# Patient Record
Sex: Female | Born: 1970 | Race: White | Hispanic: No | Marital: Married | Smoking: Never smoker
Health system: Southern US, Community
[De-identification: ages and names within clinical notes are randomized; demographics above are authoritative.]

## PROBLEM LIST (undated history)

## (undated) DIAGNOSIS — K219 Gastro-esophageal reflux disease without esophagitis: Secondary | ICD-10-CM

## (undated) DIAGNOSIS — L570 Actinic keratosis: Secondary | ICD-10-CM

## (undated) DIAGNOSIS — G43909 Migraine, unspecified, not intractable, without status migrainosus: Secondary | ICD-10-CM

## (undated) HISTORY — PX: NASAL SINUS SURGERY: SHX719

## (undated) HISTORY — DX: Actinic keratosis: L57.0

## (undated) HISTORY — DX: Migraine, unspecified, not intractable, without status migrainosus: G43.909

---

## 2000-10-15 HISTORY — PX: TUBAL LIGATION: SHX77

## 2006-09-11 ENCOUNTER — Ambulatory Visit: Payer: Self-pay | Admitting: Family Medicine

## 2006-10-31 ENCOUNTER — Encounter: Payer: Self-pay | Admitting: Obstetrics & Gynecology

## 2006-10-31 ENCOUNTER — Ambulatory Visit: Payer: Self-pay | Admitting: Obstetrics & Gynecology

## 2007-01-09 ENCOUNTER — Ambulatory Visit: Payer: Self-pay | Admitting: Obstetrics & Gynecology

## 2007-10-16 HISTORY — PX: BACK SURGERY: SHX140

## 2007-10-16 HISTORY — PX: BREAST BIOPSY: SHX20

## 2007-11-13 ENCOUNTER — Encounter: Payer: Self-pay | Admitting: Obstetrics & Gynecology

## 2007-11-13 ENCOUNTER — Ambulatory Visit: Payer: Self-pay | Admitting: Obstetrics & Gynecology

## 2007-12-10 ENCOUNTER — Ambulatory Visit (HOSPITAL_COMMUNITY): Admission: RE | Admit: 2007-12-10 | Discharge: 2007-12-11 | Payer: Self-pay | Admitting: Neurosurgery

## 2008-08-23 ENCOUNTER — Ambulatory Visit: Payer: Self-pay | Admitting: Internal Medicine

## 2011-02-27 NOTE — Assessment & Plan Note (Signed)
NAME:  Anne Montgomery, GRUSSING NO.:  [id number]   MEDICAL RECORD NO.:  [id number]          PATIENT TYPE:  POB   LOCATION:  [facility] at [facility]         FACILITY:  [facility]   PHYSICIAN:  Allie Bossier, MD        DATE OF BIRTH:  1971-09-23   DATE OF SERVICE:  11/13/2007                                  CLINIC NOTE   Ms. Alben Spittle is a 40 year old, married, white gravida 0, who is here for  annual exam.  She is wishes to continue her continuous Necon.  She is  happy with no periods and has no other complaints.   PAST MEDICAL HISTORY:  Endometriosis and lumbar disk herniation.  She  sees a chiropractor named Dr. Remo Lipps in [place].   REVIEW OF SYSTEMS:  She is child free.  She has been married for 14  years.  She is self-employed as a Administrator.  She denies dyspareunia.  She reports a mammogram done in 2008 that was normal.   PAST SURGICAL HISTORY:  Laparoscopic right salpingo-oophorectomy and a  septoplasty of her nose.   ALLERGIES:  NO LATEX ALLERGIES.  SHE DOES SAY THAT CODEINE CAUSES  ITCHING.   FAMILY HISTORY:  No GYN, breast, uterine, or colon malignancies.  Father  has a lymphoma and there is hypertension in the family.   PHYSICAL EXAMINATION:  Weight 172 pounds.  Height 5 feet 7 inches.  Blood pressure 141/89.  Pulse 62.  HEENT:  Normal.  BREASTS:  Bilaterally no nipple discharge, skin changes, or masses.  HEART:  Regular rate and rhythm.  LUNGS:  Clear to auscultation bilaterally.  ABDOMEN:  No hepatosplenomegaly.  EXTERNAL GENITALIA:  Normal.  Cervix nulliparous.  Uterus normal size  and shape, anteverted, nontender.  Adnexa nontender, no masses.   ASSESSMENT AND PLAN:  Annual exam.  I have recommended self-breast and  self-vulvar exams monthly.  Pap smear is obtained.  A mammogram will be  scheduled.      Allie Bossier, MD     MCD/MEDQ  D:  01/14/2008  T:  01/14/2008  Job:  [id number]

## 2011-02-27 NOTE — Op Note (Signed)
NAME:  Anne Montgomery, Anne Montgomery                ACCOUNT NO.:  [id number]   MEDICAL RECORD NO.:  [id number]          PATIENT TYPE:  OIB   LOCATION:  3534                         FACILITY:  [facility]   PHYSICIAN:  Hewitt Shorts, M.D.DATE OF BIRTH:  02-11-1971   DATE OF PROCEDURE:  12/10/2007  DATE OF DISCHARGE:                               OPERATIVE REPORT   PREOPERATIVE DIAGNOSES:  1. Right L5-S1 lumbar disk herniation.  2. Lumbar degenerative disk disease.  3. Lumbar spondylosis.  4. Lumbar radiculopathy.   POSTOPERATIVE DIAGNOSES:  1. Right L5-S1 lumbar disk herniation.  2. Lumbar degenerative disk disease.  3. Lumbar spondylosis.  4. Lumbar radiculopathy.   PROCEDURES:  Right L5-S1 lumbar laminotomy and microdiskectomy.   SURGEON:  Hewitt Shorts, M.D.   ASSISTANT:  Dr. Venetia Maxon.   ANESTHESIA:  General endotracheal.   INDICATIONS:  The patient is a 40 year old woman who presented with a  right lumbar radiculopathy who was found to have multilevel degenerative  disk disease and spondylosis , but with a right L4-S1 lumbar disk  herniation with a compression of the right S1 nerve root.  A decision  was made to proceed with elective laminotomy and microdiskectomy.   DESCRIPTION OF OPERATIVE PROCEDURE:  The patient was brought to the  operating room and placed under general endotracheal anesthesia.  The  patient was turned to a prone position.  Lumbar region was prepped with  Betadine soap and solution and draped in a sterile fashion.  The midline  was infiltrated with local anesthetic with epinephrine.  An x-ray was  taken.  The L5-S1 level was identified and a midline incision was made  over the L5-S1 level and carried down through the subcutaneous tissue.  Bipolar electrocautery was used to maintain hemostasis.  Dissection was  carried down to the lumbar fascia which was incised on the right side of  the midline of the paraspinal muscles, dissection of the spinous process  and  lamina in a subperiosteal fashion.  An x-ray was taken and the L5-S1  level was identified, and then a self-retaining retractor was placed,  and we proceeded with laminotomy using the X-Max drill and Kerrison  punches.  The microscope was draped and brought to the field to provide  additional magnification, illumination, and visualization.  The  remainder of the decompression was performed using microdissection and  microsurgical technique.  The laminotomy was completed and then the  ligamentum flavum was carefully removed, and we identified the thecal  sac and exiting right S1 nerve root.  These structures were retracted  medially, and we coagulated and divided epidural veins and the disk  herniation was identified being both subligamentous and with a fragment  extending caudally behind the body of S1.  We incised the remaining  annular fibers and removed the subligamentous herniation.  We removed  spondylytic ligamentous and osteophytic thickening of the annulus and  posterior longitudinal ligament, and then proceeded with a diskectomy  using a variety of pituitary rongeurs and microcurettes.  Thorough  discectomy was performed with good decompression of the thecal sac and  nerve roots, and  all loose fragments of the disk material were removed  from both the disk space and the epidural space.  Hemostasis was  established with the use of both bipolar cautery and Gelfoam soaked in  thrombin.  The Gelfoam was removed prior to closure and good hemostasis  was confirmed.  At the end, a thorough discectomy with good  decompression of all structures was achieved.  Good hemostasis was  achieved, and then, we instilled 2 mL of fentanyl and 80 mg of Depo-  Medrol into the epidural space and then proceeded with closure.  The  deep fascia was closed with interrupted undyed #1 Vicryl sutures.  The  subcutaneous and subcuticular were closed with interrupted inverted 2-0  and 3-0 undyed Vicryl sutures.   The skin was approximated with  Dermabond.  The procedure was tolerated well.  The estimated blood loss  was less than 25 cc.  Sponge count was correct.  Following surgery, the  patient was returned back to the supine position to be reversed from the  anesthetic, extubated, and transferred to the recovery room for further  care.      Hewitt Shorts, M.D.  Electronically Signed     RWN/MEDQ  D:  12/10/2007  T:  12/11/2007  Job:  [id number]

## 2011-03-02 NOTE — Assessment & Plan Note (Signed)
NAME:  Anne Montgomery, LY NO.:  [id number]   MEDICAL RECORD NO.:  [id number]          PATIENT TYPE:  POB   LOCATION:  [facility] at [facility]         FACILITY:  [facility]   PHYSICIAN:  Elsie Lincoln, MD      DATE OF BIRTH:  03/30/1971   DATE OF SERVICE:                                  CLINIC NOTE   Patient is a 40 year old nulliparous female who presents for GYN exam.  Patient's GYN history is significant for Dr. Verlee Monte did a removal of  right ovary and removal of endometriotic implants.  Patient still has  some pain, although she is being treated with Ortho-Cept.  She is being  allowed to have monthly periods.  She has had an abnormal Pap smear  before in 2005.  Her last Pap smear in 2006 was normal per the patient.  She has had a mammogram in 2006 which was normal.   PAST MEDICAL HISTORY:  Migraine headaches.   PAST SURGICAL HISTORY:  As stated above.   FAMILY HISTORY:  Mother has high blood pressure and father has a slow  growing lymphoma.   MEDICATIONS:  1. Imitrex.  2. Topamax.  3. Ortho-Cept.   SOCIAL HISTORY:  Drinks caffeinated beverages, does not smoke or drink  or do drugs.  Lives with her husband.   PHYSICAL EXAMINATION:  Pulse 62, blood pressure 124/81, weight 171.  Review of symptoms negative except for dizzy spells.  GENERAL:  Well-nourished, well-developed, no apparent distress.  HEENT:  Normocephalic, atraumatic.  THYROID:  No masses.  LUNGS:  Clear to auscultation bilaterally.  HEART:  Regular rate and rhythm.  BREASTS:  No masses, skin changes, no lymphadenopathy.  ABDOMEN:  Soft, nontender, nondistended.  No hernias, no organomegaly.  GENITALIA:  Tanner 5.  Vagina pink, normal rugae, cervix closed,  nontender.  Adnexa, no masses, nontender.  Uterus, nontender.  Perineum  intact, no cystocele, no rectocele.  EXTREMITIES:  Nontender.   PLAN:  A 40 year old female with endometriosis.  1. Ortho-Cept is even a thrombogenic pill, so we are  going to change      her to a generic 30 mcg pill that her insurance will cover.  I      believe Necon would be a good choice.  She will be on continuous      oral contraceptives and not have a period.  The patient understands      that this is safe.  2. Need Pap smear.  3. Mammogram ordered.  4. TSH and cholesterol.  5. Return to clinic in a year.           ______________________________  Elsie Lincoln, MD     KL/MEDQ  D:  11/07/2006  T:  11/07/2006  Job:  [id number]

## 2011-07-06 LAB — BASIC METABOLIC PANEL
BUN: 10
CO2: 21
Calcium: 9.6
Chloride: 107
Creatinine, Ser: 0.87
GFR calc Af Amer: >60
GFR calc non Af Amer: >60
Glucose, Bld: 79
Potassium: 4.1
Sodium: 137

## 2011-07-06 LAB — CBC
HCT: 42.8
Hemoglobin: 15.1 — ABNORMAL HIGH
MCHC: 35.2
MCV: 88.9
Platelets: 348
RBC: 4.82
RDW: 12.7
WBC: 9.7

## 2012-03-22 ENCOUNTER — Ambulatory Visit: Payer: BC Managed Care – PPO

## 2012-03-22 ENCOUNTER — Ambulatory Visit: Payer: BC Managed Care – PPO | Admitting: Family Medicine

## 2012-03-22 VITALS — BP 186/104 | HR 72 | Temp 98.1°F | Resp 18 | Wt 182.0 lb

## 2012-03-22 DIAGNOSIS — S61209A Unspecified open wound of unspecified finger without damage to nail, initial encounter: Secondary | ICD-10-CM

## 2012-03-22 DIAGNOSIS — M79645 Pain in left finger(s): Secondary | ICD-10-CM

## 2012-03-22 DIAGNOSIS — S61409A Unspecified open wound of unspecified hand, initial encounter: Secondary | ICD-10-CM

## 2012-03-22 DIAGNOSIS — M79609 Pain in unspecified limb: Secondary | ICD-10-CM

## 2012-03-22 NOTE — Patient Instructions (Signed)
Return to the clinic or go to the nearest emergency room if any of your symptoms worsen or new symptoms occur.

## 2012-03-22 NOTE — Progress Notes (Signed)
  Subjective:    Patient ID: Anne Montgomery, female    DOB: Dec 26, 1970, 41 y.o.   MRN: [id number]  HPI Anne Montgomery is a 41 y.o. 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  Controlling bleeding with pressure.   No prior l hand injury.  R hand dominant. Last td less than 5 years ago.  Review of Systems No hx of htn. Usually normal, just in pain in hand.    Objective:   Physical Exam  Constitutional: She is oriented to person, place, and time. She appears well-developed and well-nourished.  Pulmonary/Chest: Effort normal.  Neurological: She is alert and oriented to person, place, and time.  Skin: Skin is warm and dry.       ttp around wound with slight bony ttp  At base of thumb phalynx to 1st metacarpal.  Lidocaine 2% jelly applied at 1818. [facility] reading (PRIMARY) by  Dr Neva Seat: . L thumb - negative.     Assessment & Plan:  Anne Montgomery is a 41 y.o. female L thumb wound - see repair note by Eula Listen, PAC.  utd on td. Reassuring xray.    No apparent fx. Wound care - rtc precautions.   Tylenol,ibuprofen otc prn.   Elevated BP -suspected pain component as per hx - normal prior to today.  Check outside of office, and rtc or primary provider if bp remains elevated.

## 2012-03-22 NOTE — Progress Notes (Signed)
   Patient ID: Anne Montgomery MRN: [id number], DOB: 10-21-1970, 41 y.o. Date of Encounter: 03/22/2012, 7:01 PM   PROCEDURE NOTE: Verbal consent obtained. Sterile technique employed. Numbing: Anesthesia obtained with 2% plain lidocaine 3 cc  Cleansed with soap and water. Irrigated. Betadine prep per usual protocol.  Wound explored, no deep structures involved, no foreign bodies.   Wound repaired with # 5 simple interrupted sutures 5-0 Prolene Hemostasis obtained. Wound cleansed and dressed.  Wound care instructions including precautions covered with patient. Handout given.  Anticipate suture removal in 10 days  Signed, Eula Listen, PA-C 03/22/2012 7:01 PM

## 2013-12-01 ENCOUNTER — Encounter: Payer: Self-pay | Admitting: General Surgery

## 2013-12-01 ENCOUNTER — Other Ambulatory Visit: Payer: BC Managed Care – PPO

## 2013-12-01 ENCOUNTER — Ambulatory Visit (INDEPENDENT_AMBULATORY_CARE_PROVIDER_SITE_OTHER): Payer: BC Managed Care – PPO | Admitting: General Surgery

## 2013-12-01 VITALS — BP 146/76 | HR 76 | Resp 12 | Ht 68.0 in | Wt 148.0 lb

## 2013-12-01 DIAGNOSIS — R599 Enlarged lymph nodes, unspecified: Secondary | ICD-10-CM

## 2013-12-01 NOTE — Patient Instructions (Signed)
Patient to check the area once a month.

## 2013-12-01 NOTE — Progress Notes (Signed)
Patient ID: Anne Montgomery, female   DOB: 1971-07-13, 43 y.o.   MRN: [id number]  Chief Complaint  Patient presents with  . Other    inguinal lymphadnopathy     HPI Anne Montgomery is a 43 y.o. female here today for an evaluation left inguinal swelling. Patient states the area has been swollen for about three weeks . She states the area has not got any bigger in size.   The patient identified this incidentally while shaving. She denies any infections involving the left lower extremity or left trunk.  She was anxious as her father had been diagnosed with lymphoma 6 years ago requiring chemotherapy at diagnosis and ongoing therapy at present.  She has lost about 40 pounds over the last 9 months due a change in her diet and activity.  The patient denies any secondary symptoms such as fever or night sweats.  The patient reports that a small nodular area on the left posterior lateral neck is been present since childhood. The area swells when she has an upper respiratory infection (cc only) but is otherwise asymptomatic and is presently at its baseline.   HPI  History reviewed. No pertinent past medical history.  Past Surgical History  Procedure Laterality Date  . Breast biopsy  2009    back sur  . Back surgery  2009  . Tubal ligation  2002  . Nasal sinus surgery      Family History  Problem Relation Age of Onset  . Lymphoma Father 54    Social History History  Substance Use Topics  . Smoking status: Never Smoker   . Smokeless tobacco: Never Used  . Alcohol Use: No    Allergies  Allergen Reactions  . Biaxin [Clarithromycin]   . Codeine     Current Outpatient Prescriptions  Medication Sig Dispense Refill  . Misc Natural Products (JOINT SUPPORT COMPLEX PO) Take by mouth.      . SUMAtriptan (IMITREX) 100 MG tablet Take 100 mg by mouth every 2 (two) hours as needed.       No current facility-administered medications for this visit.    Review of Systems Review of  Systems  Constitutional: Negative.   Respiratory: Negative.   Cardiovascular: Negative.     Blood pressure 146/76, pulse 76, resp. rate 12, height 5\' 8"  (1.727 m), weight 148 lb (67.132 kg).  Physical Exam Physical Exam  Constitutional: She appears well-developed and well-nourished.  Eyes: Conjunctivae are normal.  Neck: Neck supple. No mass and no thyromegaly present.    Left posterior neck node  Cardiovascular: Normal rate, regular rhythm, normal heart sounds, intact distal pulses and normal pulses.   Pulmonary/Chest: Effort normal and breath sounds normal.  Abdominal: Soft. Normal appearance and bowel sounds are normal. There is no hepatosplenomegaly. There is no tenderness.  Lymphadenopathy:       Head (left side): Posterior auricular adenopathy present.    She has no cervical adenopathy.    She has no axillary adenopathy.       Left: Inguinal adenopathy present.  Mildly prominent left inguinal nodes. The area of patient concern is below the inguinal ligament and medial to the vessels. The most prominent area on my exam is just above the inguinal ligament, about 3 cm superior to the patient's index lesion.  Neurological: She is alert.  Skin: Skin is warm and dry.  Examination of the left lower extremity from the toes to the hip showed no evidence of inflammatory process.    Data  Reviewed Ultrasound examination of the inguinal area was completed. The area I appreciated on exam is a less than 0.8 x 0.8 x 0.5 cm well-circumscribed lymph node with normal echo architecture. Adjacent to this are multiple adjacent lymph nodes measuring less than 0.8 cm in diameter.  The area of patient concern is a 0.4 x 0.6 x 0.82 cm softly lobulated lymph node with a normal hyperechoic center and hypoechoic cortex. No increased vascular flow is noted on duplex imaging.  Brief examination of the contralateral groin for comparison showed at least one lymph node measuring 0.5 x 0.75 x 0.8 cm. This had  a normal echo architecture and no increased vascularity.  Assessment    Benign prominence of inguinal nodes secondary to the patient's thin body habitus.    Plan    A CBC will be obtained by the patient in the next week. If the differential is normal, we'll plan on a repeat clinical exam in 3 months.  She was encouraged to check the area monthly at the time of her breast exam, but otherwise to avoid manipulation. She was encouraged to call if enlargement is noted.       Robert Bellow 12/01/2013, 3:23 PM

## 2013-12-08 ENCOUNTER — Telehealth: Payer: Self-pay | Admitting: *Deleted

## 2013-12-08 LAB — CBC WITH DIFFERENTIAL
Basophils Absolute: 0 10*3/uL (ref 0.0–0.2)
Basos: 0 %
Eos: 2 %
Eosinophils Absolute: 0.2 10*3/uL (ref 0.0–0.4)
HCT: 44 % (ref 34.0–46.6)
Hemoglobin: 14.9 g/dL (ref 11.1–15.9)
Immature Grans (Abs): 0 10*3/uL (ref 0.0–0.1)
Immature Granulocytes: 0 %
Lymphocytes Absolute: 2.7 10*3/uL (ref 0.7–3.1)
Lymphs: 31 %
MCH: 30.8 pg (ref 26.6–33.0)
MCHC: 33.9 g/dL (ref 31.5–35.7)
MCV: 91 fL (ref 79–97)
Monocytes Absolute: 0.6 10*3/uL (ref 0.1–0.9)
Monocytes: 6 %
Neutrophils Absolute: 5.4 10*3/uL (ref 1.4–7.0)
Neutrophils Relative %: 61 %
Platelets: 336 10*3/uL (ref 150–379)
RBC: 4.84 x10E6/uL (ref 3.77–5.28)
RDW: 12.7 % (ref 12.3–15.4)
WBC: 8.9 10*3/uL (ref 3.4–10.8)

## 2013-12-08 NOTE — Telephone Encounter (Signed)
Message copied by Carson Myrtle on Tue Dec 08, 2013 11:11 AM ------      Message from: Robert Bellow      Created: Tue Dec 08, 2013 10:44 AM       Please notify the patient that her blood count was completely normal. F/U as scheduled in three months. Thanks.      ----- Message -----         From: [facility] Lab Results In Interface         Sent: 12/08/2013   5:44 AM           To: Robert Bellow, MD                   ------

## 2013-12-08 NOTE — Telephone Encounter (Signed)
Notified patient as instructed, patient pleased. Discussed follow-up appointments, patient agrees  

## 2014-01-06 ENCOUNTER — Other Ambulatory Visit: Payer: Self-pay

## 2014-01-06 DIAGNOSIS — R599 Enlarged lymph nodes, unspecified: Secondary | ICD-10-CM

## 2014-01-12 ENCOUNTER — Telehealth: Payer: Self-pay | Admitting: General Surgery

## 2014-01-12 NOTE — Telephone Encounter (Signed)
Chart reviewed. Patient had originally been concern regarding lymphadenopathy. Ultrasound did not identify large lymph nodes. Scheduled three-month followup for reassessment has been canceled by the patient. Assuming the lymph nodes are no longer symptomatic, this is appropriate.

## 2014-01-12 NOTE — Telephone Encounter (Signed)
PATIENT CALLED IN TO CANCEL HER APPT FOR 02-24-14  FOR A 3 MONTH F/U LYMPHADENOPATHY WITH CBC/DIFF PRIOR TO APPT. SHE STATES SHE DOESN'T FEEL THIS APPT IS NECESSARY./MTH

## 2014-02-24 ENCOUNTER — Ambulatory Visit: Payer: BC Managed Care – PPO | Admitting: General Surgery

## 2014-08-05 DIAGNOSIS — D239 Other benign neoplasm of skin, unspecified: Secondary | ICD-10-CM

## 2014-08-05 HISTORY — DX: Other benign neoplasm of skin, unspecified: D23.9

## 2014-09-05 ENCOUNTER — Ambulatory Visit: Payer: Self-pay | Admitting: Physician Assistant

## 2014-10-19 ENCOUNTER — Ambulatory Visit: Payer: Self-pay | Admitting: Family Medicine

## 2015-03-06 IMAGING — US ABDOMEN ULTRASOUND
1 series · 14 of 25 positions shown · non-contrast
Comparison: None.

CLINICAL DATA: Ten days of epigastric abdominal pain

EXAM:
ULTRASOUND ABDOMEN COMPLETE

[Series 1: abdomen ultrasound · 0.23mm/px · 14 of 79 slices shown]
[im 1/79]
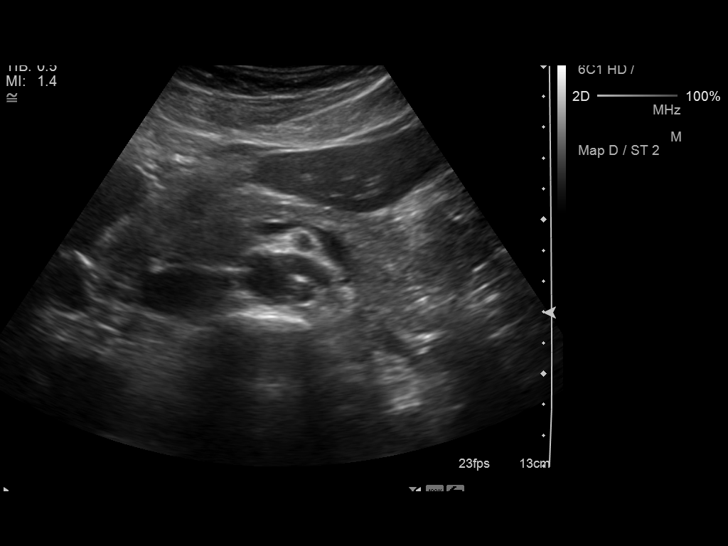
[im 7/79]
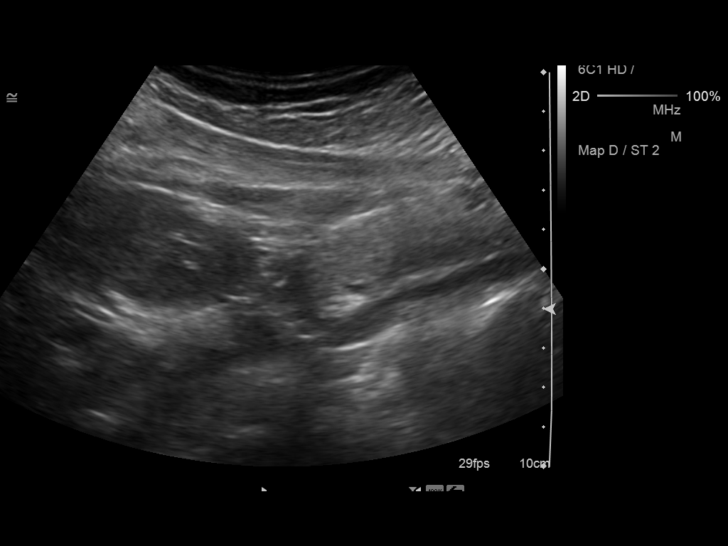
[im 14/79]
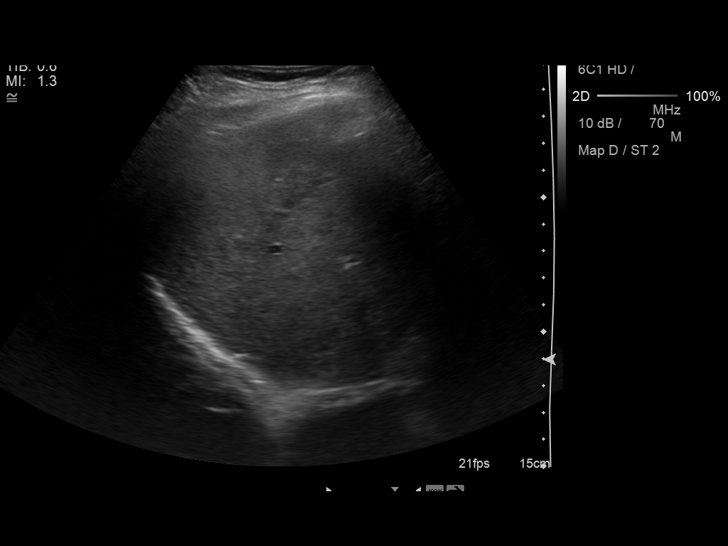
[im 20/79]
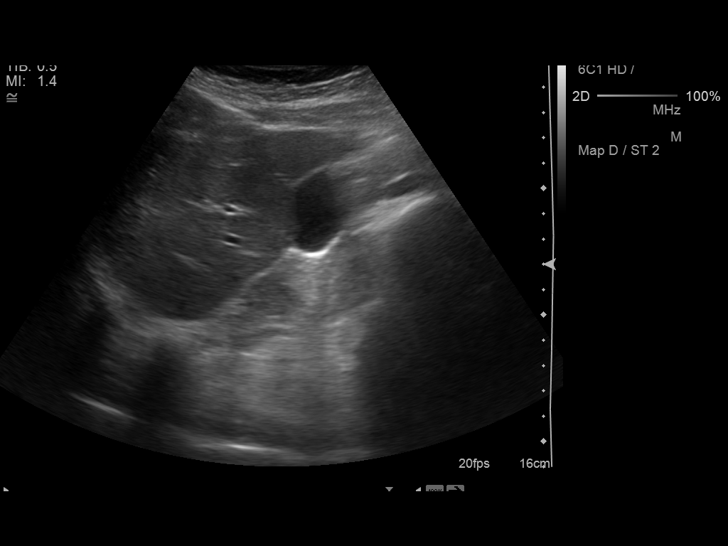
[im 27/79]
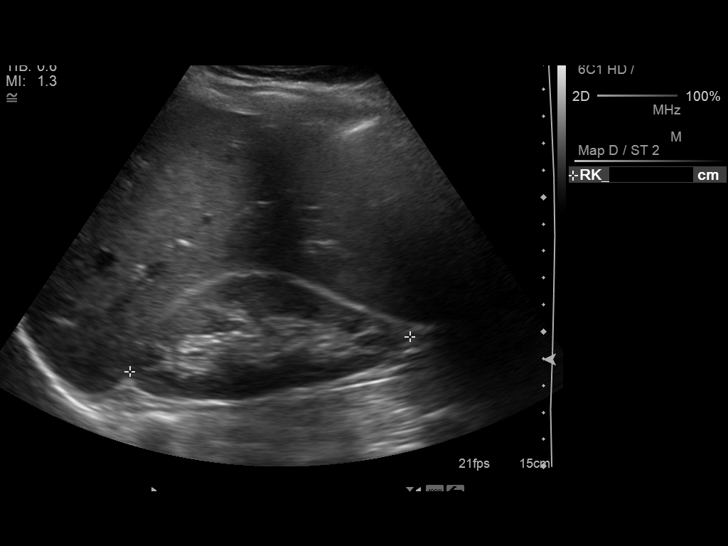
[im 30/79]
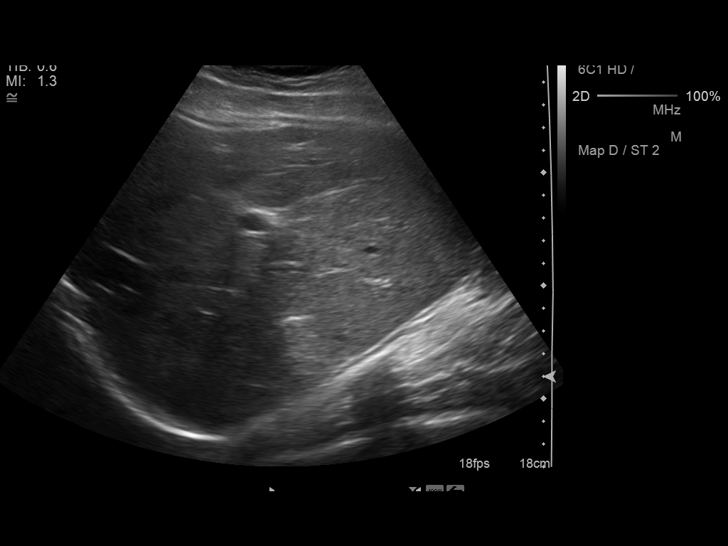
[im 36/79]
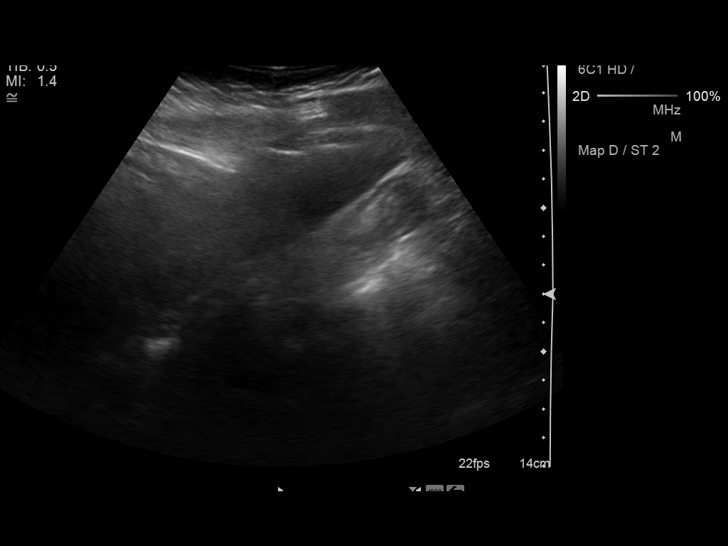
[im 43/79]
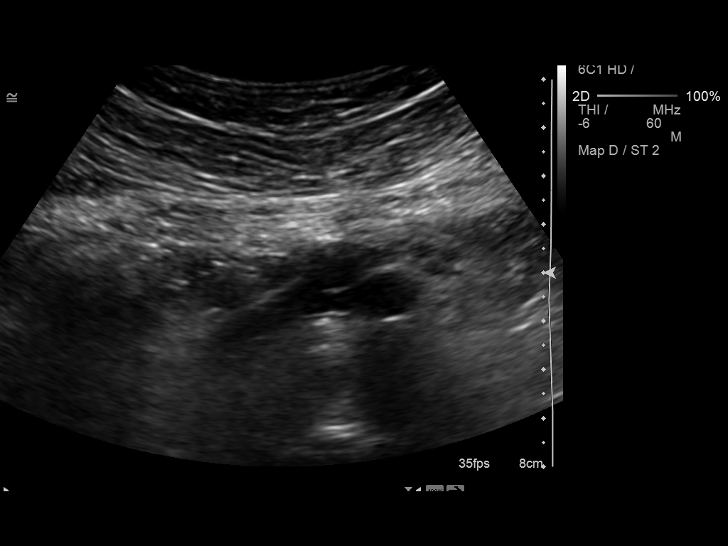
[im 49/79]
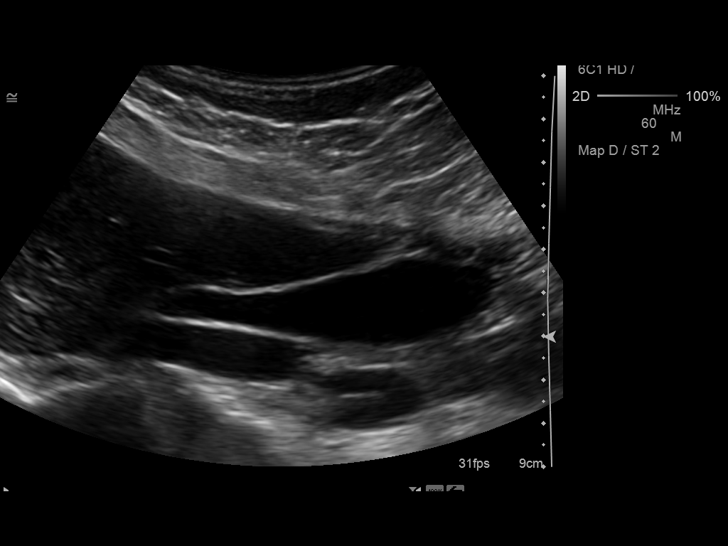
[im 53/79]
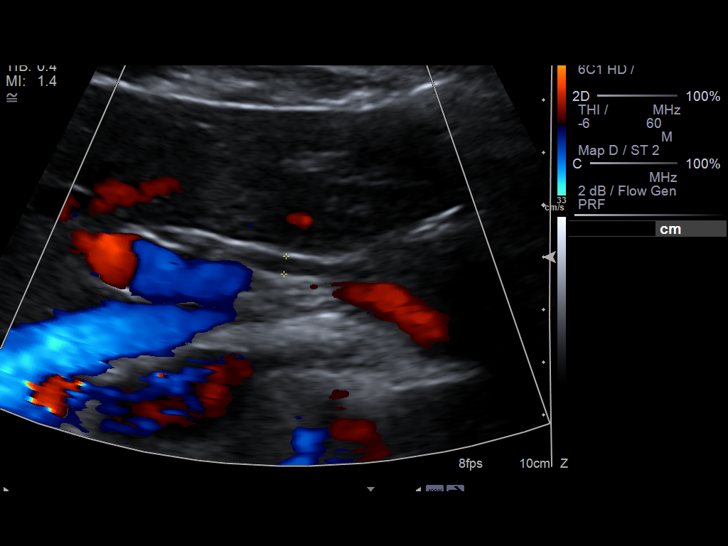
[im 59/79]
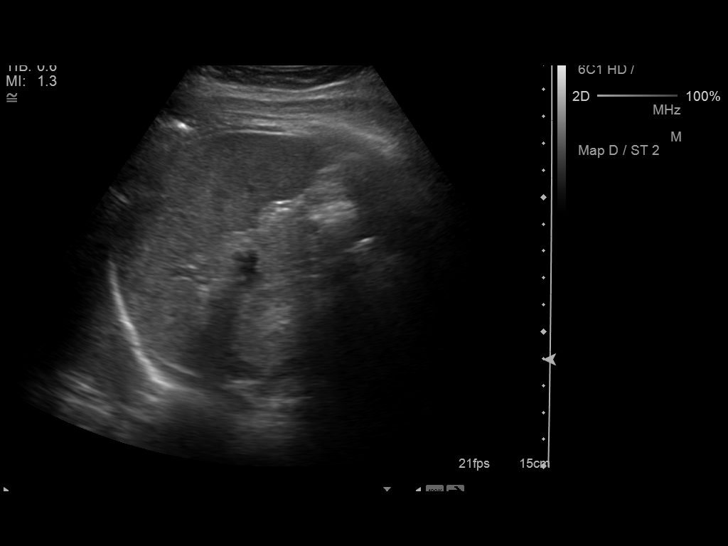
[im 66/79]
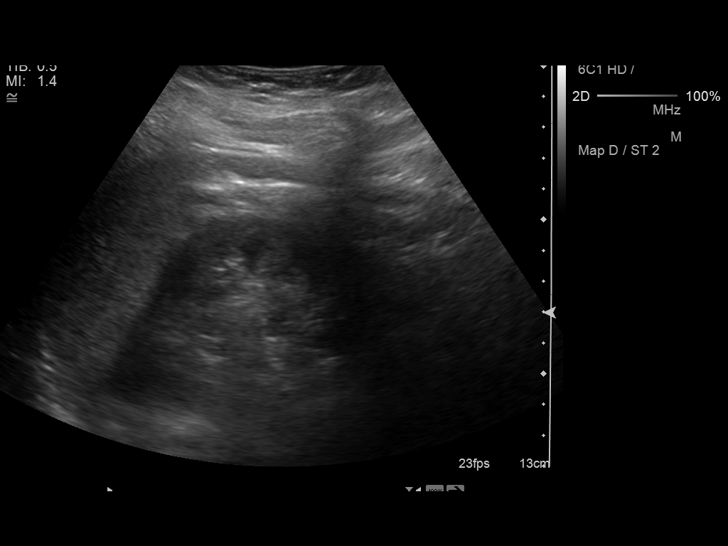
[im 72/79]
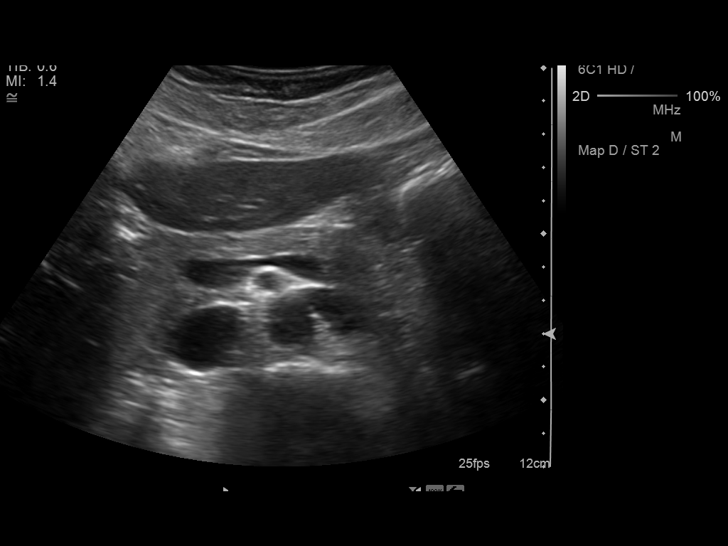
[im 79/79]
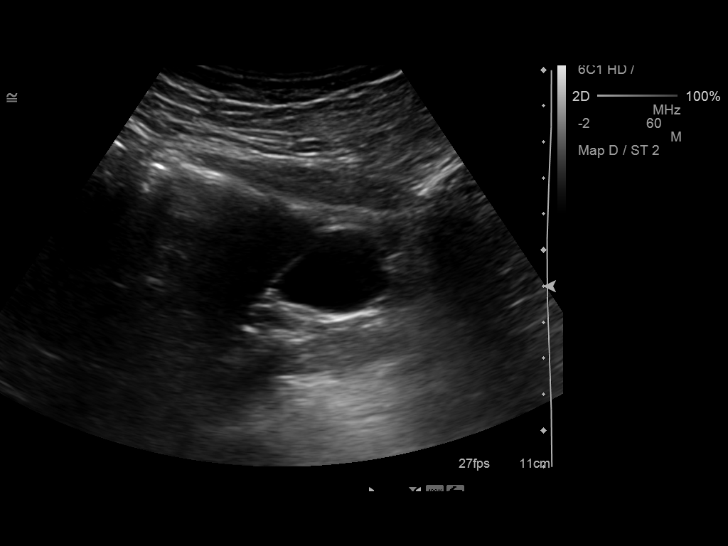

[14 of 25 positions shown; findings below may reference images not displayed]

FINDINGS: Gallbladder: No gallstones or wall thickening visualized. No
sonographic Murphy sign noted.

Common bile duct: Diameter: 3.4 mm

Liver: No focal lesion identified. Within normal limits in
parenchymal echogenicity.

IVC: No abnormality visualized.

Pancreas: Visualized portion unremarkable.

Spleen: Size and appearance within normal limits.

Right Kidney: Length: 10.5 cm. Echogenicity is approximately equal
to that of the adjacent liver. No mass or hydronephrosis visualized.

Left Kidney: Length: 10 cm. Echogenicity is approximately equal to
that of the adjacent liver. No mass or hydronephrosis visualized.

Abdominal aorta: No aneurysm visualized.

Other findings: No ascites is demonstrated.
IMPRESSION: No acute intra-abdominal abnormality is demonstrated. The renal
cortical echotexture is mildly increased and is approximately equal
to that of the adjacent liver. Correlation with the patient's renal
function studies is recommended.

## 2015-09-13 ENCOUNTER — Encounter: Payer: Self-pay | Admitting: Family Medicine

## 2015-09-13 ENCOUNTER — Ambulatory Visit (INDEPENDENT_AMBULATORY_CARE_PROVIDER_SITE_OTHER): Payer: BLUE CROSS/BLUE SHIELD | Admitting: Family Medicine

## 2015-09-13 VITALS — BP 122/68 | HR 73 | Temp 97.8°F | Resp 16 | Ht 68.0 in | Wt 166.6 lb

## 2015-09-13 DIAGNOSIS — J0100 Acute maxillary sinusitis, unspecified: Secondary | ICD-10-CM

## 2015-09-13 MED ORDER — AMOXICILLIN-POT CLAVULANATE 875-125 MG PO TABS: 1.0000 | ORAL_TABLET | Freq: Two times a day (BID) | ORAL | Status: DC

## 2015-09-13 NOTE — Patient Instructions (Signed)
Add Mucinex D. Continue sinus irrigation.

## 2015-09-13 NOTE — Progress Notes (Signed)
Subjective:     Patient ID: Anne Montgomery, female   DOB: November 19, 1970, 44 y.o.   MRN: [id number]  HPI  Chief Complaint  Patient presents with  . Sinus Problem    Patient comes in office today with concerns of sinus pain and pressure since Fri 11/26. Patient reports that she has bilateral ear pain, sore throat, cough and post nasal drainage. Patient has tried taking otc Advil, Sinus rinse and Airborne.   Patient reports increased sinus pressure, purulent sinus drainage, post nasal drainage and accompanying cough. Thought she was improving until the last 24 hours. Hx of sinus surgery with residual scarring.   Review of Systems  Constitutional: Negative for fever and chills.       + muscle aches       Objective:   Physical Exam  Constitutional: She appears well-developed and well-nourished. No distress.  Ears: T.M's intact without inflammation Sinuses: maxillary sinus tenderness Throat: no tonsillar enlargement or exudate Neck: left anterior cervical tenderness Lungs: clear     Assessment:    1. Acute maxillary sinusitis, recurrence not specified - amoxicillin-clavulanate (AUGMENTIN) 875-125 MG tablet; Take 1 tablet by mouth 2 (two) times daily.  Dispense: 20 tablet; Refill: 0    Plan:    Add Mucinex D and continue sinus irrigation.

## 2015-12-26 ENCOUNTER — Encounter: Payer: Self-pay | Admitting: Family Medicine

## 2015-12-26 ENCOUNTER — Ambulatory Visit (INDEPENDENT_AMBULATORY_CARE_PROVIDER_SITE_OTHER): Payer: BLUE CROSS/BLUE SHIELD | Admitting: Family Medicine

## 2015-12-26 VITALS — BP 110/80 | HR 66 | Temp 98.3°F | Resp 16 | Wt 169.6 lb

## 2015-12-26 DIAGNOSIS — R42 Dizziness and giddiness: Secondary | ICD-10-CM | POA: Diagnosis not present

## 2015-12-26 DIAGNOSIS — R5383 Other fatigue: Secondary | ICD-10-CM

## 2015-12-26 DIAGNOSIS — R519 Headache, unspecified: Secondary | ICD-10-CM

## 2015-12-26 DIAGNOSIS — H9312 Tinnitus, left ear: Secondary | ICD-10-CM | POA: Diagnosis not present

## 2015-12-26 DIAGNOSIS — G43909 Migraine, unspecified, not intractable, without status migrainosus: Secondary | ICD-10-CM | POA: Insufficient documentation

## 2015-12-26 DIAGNOSIS — R51 Headache: Secondary | ICD-10-CM

## 2015-12-26 MED ORDER — TOPIRAMATE 25 MG PO TABS: 25.0000 mg | ORAL_TABLET | Freq: Two times a day (BID) | ORAL | Status: DC

## 2015-12-26 NOTE — Progress Notes (Signed)
Subjective:     Patient ID: Anne Montgomery, female   DOB: 1971-06-09, 45 y.o.   MRN: [id number]  HPI  Chief Complaint  Patient presents with  . Dizziness    Patienmt comes in office today with concerns of dizziness for the past week and half. Patient describes dizziness as " water floating in head," patient also reports the following associated symptoms: fatige, ringing in ears (mostly left) and headache.   States she returned from a cruise when her symptoms started two weeks ago. Reports intermittent 5/10 headaches which last < 30 minutes-unnlike her usual migraines which she gets 3 x year. Reports similar sx 12 years ago and was seen at a headache clinic. Reports she was felt to have a headache syndrome and placed on Topamax which resolved her sx. Denies vertigo, stress, and states she in on continuous birth control.   Review of Systems     Objective:   Physical Exam  Constitutional: She appears well-developed and well-nourished. She appears distressed (mild, lying on exam table).  HENT:  Right Ear: Tympanic membrane normal.  Left Ear: Tympanic membrane normal.  Eyes: EOM are normal. Pupils are equal, round, and reactive to light.  Cardiovascular: Normal rate and regular rhythm.   No carotid bruits  Pulmonary/Chest: Breath sounds normal.       Assessment:    1. Dizziness  2. Other fatigue - CBC with Differential/Platelet - TSH - Comprehensive metabolic panel  3. Acute nonintractable headache, unspecified headache type - topiramate (TOPAMAX) 25 MG tablet; Take 1 tablet (25 mg total) by mouth 2 (two) times daily.  Dispense: 30 tablet; Refill: 0  4. Tinnitus, left    Plan:    Further f/u pending labs results. Topamax trial. Consider CT scan.

## 2015-12-26 NOTE — Patient Instructions (Signed)
We will call you with the lab results. 

## 2015-12-27 ENCOUNTER — Telehealth: Payer: Self-pay

## 2015-12-27 LAB — CBC WITH DIFFERENTIAL/PLATELET
Basophils Absolute: 0 10*3/uL (ref 0.0–0.2)
Basos: 0 %
EOS (ABSOLUTE): 0.1 10*3/uL (ref 0.0–0.4)
Eos: 1 %
Hematocrit: 43.6 % (ref 34.0–46.6)
Hemoglobin: 14.5 g/dL (ref 11.1–15.9)
Immature Grans (Abs): 0 10*3/uL (ref 0.0–0.1)
Immature Granulocytes: 0 %
Lymphocytes Absolute: 1.8 10*3/uL (ref 0.7–3.1)
Lymphs: 21 %
MCH: 30.2 pg (ref 26.6–33.0)
MCHC: 33.3 g/dL (ref 31.5–35.7)
MCV: 91 fL (ref 79–97)
Monocytes Absolute: 0.5 10*3/uL (ref 0.1–0.9)
Monocytes: 6 %
Neutrophils Absolute: 6 10*3/uL (ref 1.4–7.0)
Neutrophils: 72 %
Platelets: 307 10*3/uL (ref 150–379)
RBC: 4.8 x10E6/uL (ref 3.77–5.28)
RDW: 12.7 % (ref 12.3–15.4)
WBC: 8.4 10*3/uL (ref 3.4–10.8)

## 2015-12-27 LAB — COMPREHENSIVE METABOLIC PANEL
ALT: 8 IU/L (ref 0–32)
AST: 13 IU/L (ref 0–40)
Albumin/Globulin Ratio: 1.9 (ref 1.2–2.2)
Albumin: 4.3 g/dL (ref 3.5–5.5)
Alkaline Phosphatase: 43 IU/L (ref 39–117)
BUN/Creatinine Ratio: 18 (ref 9–23)
BUN: 14 mg/dL (ref 6–24)
Bilirubin Total: 0.4 mg/dL (ref 0.0–1.2)
CO2: 20 mmol/L (ref 18–29)
Calcium: 9.4 mg/dL (ref 8.7–10.2)
Chloride: 100 mmol/L (ref 96–106)
Creatinine, Ser: 0.77 mg/dL (ref 0.57–1.00)
GFR calc Af Amer: 107 mL/min/{1.73_m2} (ref >59)
GFR calc non Af Amer: 93 mL/min/{1.73_m2} (ref >59)
Globulin, Total: 2.3 g/dL (ref 1.5–4.5)
Glucose: 85 mg/dL (ref 65–99)
Potassium: 4.3 mmol/L (ref 3.5–5.2)
Sodium: 138 mmol/L (ref 134–144)
Total Protein: 6.6 g/dL (ref 6.0–8.5)

## 2015-12-27 LAB — TSH: TSH: 3.02 u[IU]/mL (ref 0.450–4.500)

## 2015-12-27 NOTE — Telephone Encounter (Signed)
-----   Message from Carmon Ginsberg, [place] sent at 12/27/2015  7:56 AM EDT ----- All of your labs are normal including sugar and blood count. Try the Topamax and see if that helps. If not we will do a brain CT scan.

## 2015-12-27 NOTE — Telephone Encounter (Signed)
Patient has been advised. KW 

## 2015-12-27 NOTE — Telephone Encounter (Signed)
Pt returned call. Thanks TNP °

## 2015-12-27 NOTE — Telephone Encounter (Signed)
[id number] 

## 2017-03-08 ENCOUNTER — Ambulatory Visit: Payer: BLUE CROSS/BLUE SHIELD

## 2017-03-12 ENCOUNTER — Ambulatory Visit (INDEPENDENT_AMBULATORY_CARE_PROVIDER_SITE_OTHER): Payer: BLUE CROSS/BLUE SHIELD | Admitting: Urology

## 2017-03-12 ENCOUNTER — Encounter: Payer: Self-pay | Admitting: Urology

## 2017-03-12 VITALS — BP 130/71 | HR 72 | Ht 68.0 in | Wt 172.4 lb

## 2017-03-12 DIAGNOSIS — R3129 Other microscopic hematuria: Secondary | ICD-10-CM | POA: Diagnosis not present

## 2017-03-12 LAB — URINALYSIS, COMPLETE
Bilirubin, UA: NEGATIVE
Glucose, UA: NEGATIVE
Ketones, UA: NEGATIVE
Leukocytes, UA: NEGATIVE
Nitrite, UA: NEGATIVE
Protein, UA: NEGATIVE
Specific Gravity, UA: 1.01 (ref 1.005–1.030)
Urobilinogen, Ur: 0.2 mg/dL (ref 0.2–1.0)
pH, UA: 6.5 (ref 5.0–7.5)

## 2017-03-12 LAB — MICROSCOPIC EXAMINATION: RBC, UA: NONE SEEN /hpf (ref 0–?)

## 2017-03-12 NOTE — Progress Notes (Signed)
03/12/2017 9:54 AM   Anne Montgomery [birth year] [id number]  Referring provider: Carmon Ginsberg, [place], [place] [address]  Chief Complaint  Patient presents with  . Hematuria    HPI: Consultation by Dr. Nori Riis for hematuria. Ua looks like a dip on the outside records. Pelvic exam was normal 02/07/2017. No gross hematuria. Denies kidney stones, flank pain or UTI. She denies LUTS. No smoking or dye exposure. 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 She did have a "dye" test maybe 20 yrs ago to look for "kidney stones". She had an ovary removed for endometriosis and no other pelvic surgery. Her mom had kidney cancer and was referred to [facility] for partial Nx (she had had many prior abd surgeries).    PMH: Past Medical History:  Diagnosis Date  . Migraines     Surgical History: Past Surgical History:  Procedure Laterality Date  . BACK SURGERY  2009  . BREAST BIOPSY  2009   back sur  . NASAL SINUS SURGERY    . TUBAL LIGATION  2002    Home Medications:  Allergies as of 03/12/2017      Reactions   Biaxin [clarithromycin]    Codeine       Medication List       Accurate as of 03/12/17  9:54 AM. Always use your most recent med list.          ALAYCEN 1/35 tablet Generic drug:  norethindrone-ethinyl estradiol 1/35 TAKE 1 TABLET BY MOUTH EVERY DAY (ONLY TAKING ACTIVE TABS)   GINGER ROOT PO Take by mouth.   JOINT SUPPORT COMPLEX PO Take by mouth.   SUMAtriptan 50 MG tablet Commonly known as:  IMITREX TAKE 1 TABLET BY MOUTH AT ONSET OF HEADACHE. MAY REPEAT IF NEEDED IN 2 HOURS   topiramate 25 MG tablet Commonly known as:  TOPAMAX Take 1 tablet (25 mg total) by mouth 2 (two) times daily.       Allergies:  Allergies  Allergen Reactions  . Biaxin [Clarithromycin]   . Codeine     Family History: Family History  Problem Relation Age of Onset  . Lymphoma Father 91  . Prostate cancer Father   . Cancer  Father        lymphoma  . Kidney cancer Mother   . Bladder Cancer Neg Hx     Social History:  reports that she has never smoked. She has never used smokeless tobacco. She reports that she does not drink alcohol or use drugs.  ROS:                                        Physical Exam: BP 130/71 (BP Location: Left Arm, Patient Position: Sitting, Cuff Size: Normal)   Pulse 72   Ht 5\' 8"  (1.727 m)   Wt 78.2 kg (172 lb 6.4 oz)   BMI 26.21 kg/m   Constitutional:  Alert and oriented, No acute distress. HEENT: [place] AT, moist mucus membranes.  Trachea midline, no masses. Cardiovascular: No clubbing, cyanosis, or edema. Respiratory: Normal respiratory effort, no increased work of breathing. GU: No CVA tenderness.  Skin: No rashes, bruises or suspicious lesions. Neurologic: Grossly intact, no focal deficits, moving all 4 extremities. Psychiatric: Normal mood and affect.  Laboratory Data: Lab Results  Component Value Date   WBC 8.4 12/26/2015   HGB 14.9 12/07/2013  HCT 43.6 12/26/2015   MCV 91 12/26/2015   PLT 307 12/26/2015    Lab Results  Component Value Date   CREATININE 0.77 12/26/2015    No results found for: PSA  No results found for: TESTOSTERONE  No results found for: HGBA1C  Urinalysis No results found for: COLORURINE, APPEARANCEUR, LABSPEC, PHURINE, GLUCOSEU, HGBUR, BILIRUBINUR, KETONESUR, PROTEINUR, UROBILINOGEN, NITRITE, LEUKOCYTESUR  Assessment & Plan:   1. Microscopic hematuria - may be false positives on the dip, but given exposure risk and prior h/o of pain and IVP, family history, we'll screen the GU tract with cysto and renal US. She elects to proceed.  - Urinalysis, Complete   No Follow-up on file.  Festus Aloe, [facility] [address] [place], [place] [address] [phone]

## 2017-04-01 ENCOUNTER — Ambulatory Visit
Admission: RE | Admit: 2017-04-01 | Discharge: 2017-04-01 | Disposition: A | Discharge status: Home / Self Care | Payer: BLUE CROSS/BLUE SHIELD | Source: Ambulatory Visit | Attending: Urology | Admitting: Urology

## 2017-04-01 DIAGNOSIS — R3129 Other microscopic hematuria: Secondary | ICD-10-CM | POA: Diagnosis present

## 2017-04-02 ENCOUNTER — Telehealth: Payer: Self-pay | Admitting: *Deleted

## 2017-04-02 NOTE — Telephone Encounter (Signed)
-----   Message from Festus Aloe, MD sent at 04/01/2017 10:25 PM EDT ----- Notify patient -- renal US was normal - f/u for repeat  UA and cystoscopy as planned   ----- Message ----- From: Orlene Erm, CMA Sent: 04/01/2017   9:03 AM To: Festus Aloe, MD    ----- Message ----- From: Interface, Rad Results In Sent: 04/01/2017   8:21 AM To: Rowe Robert Clinical

## 2017-04-02 NOTE — Telephone Encounter (Signed)
Spoke with patient and gave results. Patient wanted to cancel cystoscopy appointment when I inquired she stated her gyn had found the blood in her urine for a long time and when she finally was referred the dr here told her there was no blood in her urine and now that her [place] is normal she doesn't want to spend any more money on this. [place] cost her 500 yesterday and everything seems normal. I let patient know that I would let the Dr. Gwyndolyn Kaufman.

## 2017-04-02 NOTE — Telephone Encounter (Signed)
LMOM for patient after I gave dr. Junious Silk not about cx cystoscopy Let patient know Dr. Is ok with cx cystoscopy but would like to repeat a urine in 6 weeks call office back and we will schedule an appointment.

## 2017-04-08 ENCOUNTER — Other Ambulatory Visit: Payer: BLUE CROSS/BLUE SHIELD

## 2017-07-11 ENCOUNTER — Other Ambulatory Visit: Payer: Self-pay | Admitting: Family Medicine

## 2017-07-11 MED ORDER — SUMATRIPTAN SUCCINATE 50 MG PO TABS: ORAL_TABLET | ORAL | 1 refills | Status: AC

## 2017-07-11 NOTE — Telephone Encounter (Signed)
This is a patients of Bobs please review. KW

## 2017-07-11 NOTE — Telephone Encounter (Signed)
Pt contacted office for refill request on the following medications:  SUMAtriptan (IMITREX) 50 MG tablet   [facility].  CB#[phone]/MW  This is a pt of Bob's

## 2017-10-16 DIAGNOSIS — M530 Cervicocranial syndrome: Secondary | ICD-10-CM | POA: Diagnosis not present

## 2017-10-16 DIAGNOSIS — M9901 Segmental and somatic dysfunction of cervical region: Secondary | ICD-10-CM | POA: Diagnosis not present

## 2017-10-16 DIAGNOSIS — M531 Cervicobrachial syndrome: Secondary | ICD-10-CM | POA: Diagnosis not present

## 2017-10-21 DIAGNOSIS — M4726 Other spondylosis with radiculopathy, lumbar region: Secondary | ICD-10-CM | POA: Diagnosis not present

## 2017-10-21 DIAGNOSIS — M5440 Lumbago with sciatica, unspecified side: Secondary | ICD-10-CM | POA: Diagnosis not present

## 2017-10-21 DIAGNOSIS — M5136 Other intervertebral disc degeneration, lumbar region: Secondary | ICD-10-CM | POA: Diagnosis not present

## 2017-10-21 DIAGNOSIS — M5416 Radiculopathy, lumbar region: Secondary | ICD-10-CM | POA: Diagnosis not present

## 2017-10-28 DIAGNOSIS — M5442 Lumbago with sciatica, left side: Secondary | ICD-10-CM | POA: Diagnosis not present

## 2017-10-28 DIAGNOSIS — M9903 Segmental and somatic dysfunction of lumbar region: Secondary | ICD-10-CM | POA: Diagnosis not present

## 2017-10-28 DIAGNOSIS — M5386 Other specified dorsopathies, lumbar region: Secondary | ICD-10-CM | POA: Diagnosis not present

## 2017-11-25 DIAGNOSIS — M5386 Other specified dorsopathies, lumbar region: Secondary | ICD-10-CM | POA: Diagnosis not present

## 2017-11-25 DIAGNOSIS — M9903 Segmental and somatic dysfunction of lumbar region: Secondary | ICD-10-CM | POA: Diagnosis not present

## 2017-12-24 DIAGNOSIS — M5386 Other specified dorsopathies, lumbar region: Secondary | ICD-10-CM | POA: Diagnosis not present

## 2017-12-24 DIAGNOSIS — M9903 Segmental and somatic dysfunction of lumbar region: Secondary | ICD-10-CM | POA: Diagnosis not present

## 2018-01-21 DIAGNOSIS — M531 Cervicobrachial syndrome: Secondary | ICD-10-CM | POA: Diagnosis not present

## 2018-01-21 DIAGNOSIS — M9902 Segmental and somatic dysfunction of thoracic region: Secondary | ICD-10-CM | POA: Diagnosis not present

## 2018-01-29 DIAGNOSIS — G43101 Migraine with aura, not intractable, with status migrainosus: Secondary | ICD-10-CM | POA: Diagnosis not present

## 2018-01-29 DIAGNOSIS — M531 Cervicobrachial syndrome: Secondary | ICD-10-CM | POA: Diagnosis not present

## 2018-01-29 DIAGNOSIS — M530 Cervicocranial syndrome: Secondary | ICD-10-CM | POA: Diagnosis not present

## 2018-01-29 DIAGNOSIS — M9902 Segmental and somatic dysfunction of thoracic region: Secondary | ICD-10-CM | POA: Diagnosis not present

## 2018-02-13 DIAGNOSIS — Z01419 Encounter for gynecological examination (general) (routine) without abnormal findings: Secondary | ICD-10-CM | POA: Diagnosis not present

## 2018-02-13 DIAGNOSIS — Z6828 Body mass index (BMI) 28.0-28.9, adult: Secondary | ICD-10-CM | POA: Diagnosis not present

## 2018-02-13 DIAGNOSIS — Z1231 Encounter for screening mammogram for malignant neoplasm of breast: Secondary | ICD-10-CM | POA: Diagnosis not present

## 2018-02-18 DIAGNOSIS — G43101 Migraine with aura, not intractable, with status migrainosus: Secondary | ICD-10-CM | POA: Diagnosis not present

## 2018-02-18 DIAGNOSIS — M9901 Segmental and somatic dysfunction of cervical region: Secondary | ICD-10-CM | POA: Diagnosis not present

## 2018-02-18 DIAGNOSIS — M530 Cervicocranial syndrome: Secondary | ICD-10-CM | POA: Diagnosis not present

## 2018-07-29 IMAGING — US US RENAL
1 series · 14 of 25 positions shown · non-contrast
Comparison: 9.9 cm

CLINICAL DATA: Microhematuria

EXAM:
RENAL / URINARY TRACT ULTRASOUND COMPLETE

[Series 1: us renal · 0.26mm/px · 14 of 33 slices shown]
[im 1/33]
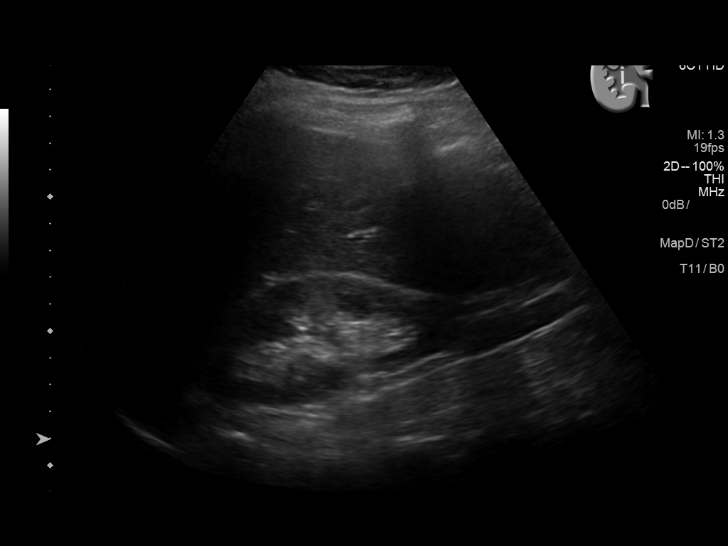
[im 3/33]
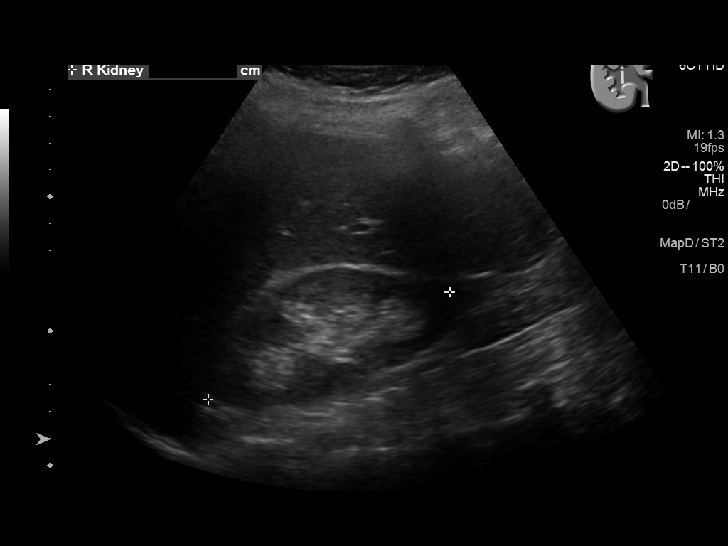
[im 6/33]
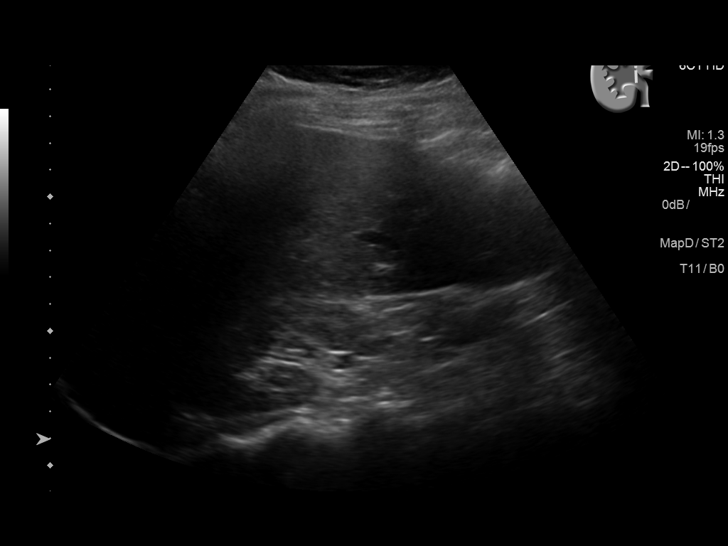
[im 9/33]
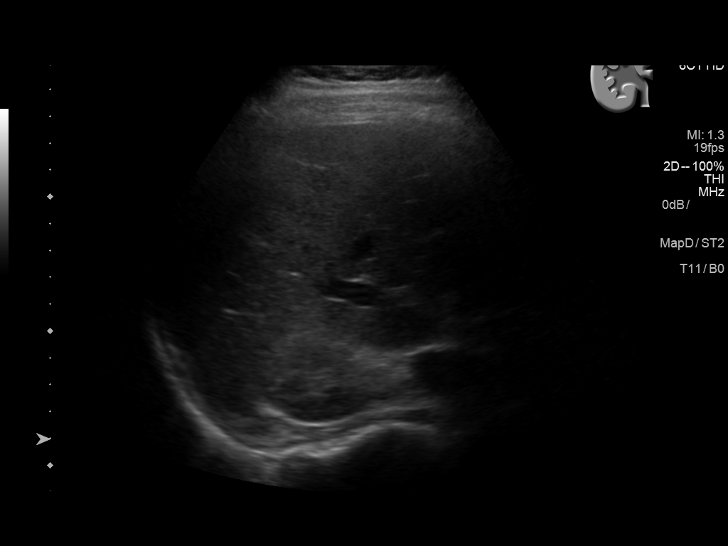
[im 11/33]
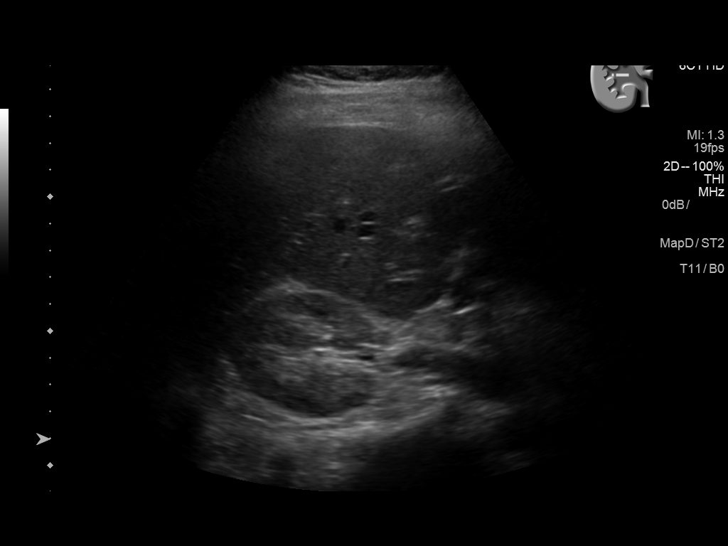
[im 13/33]
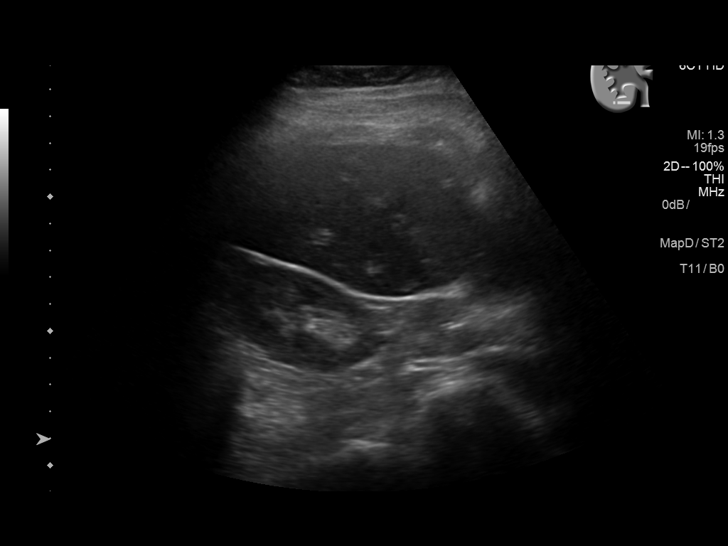
[im 15/33]
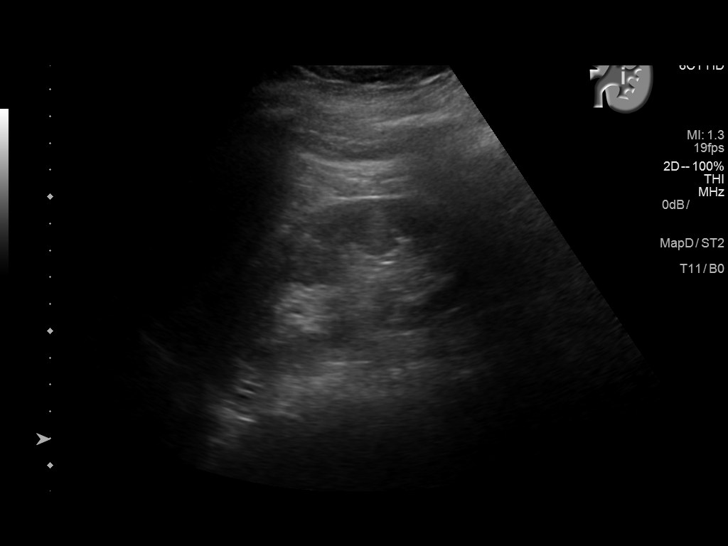
[im 18/33]
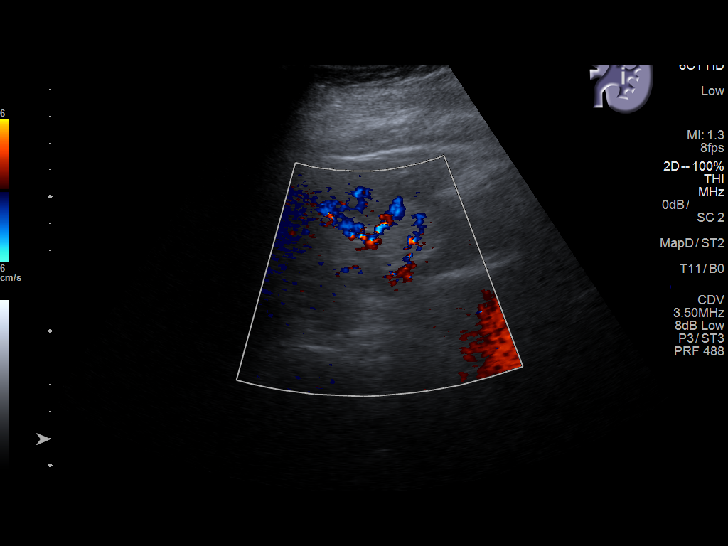
[im 21/33]
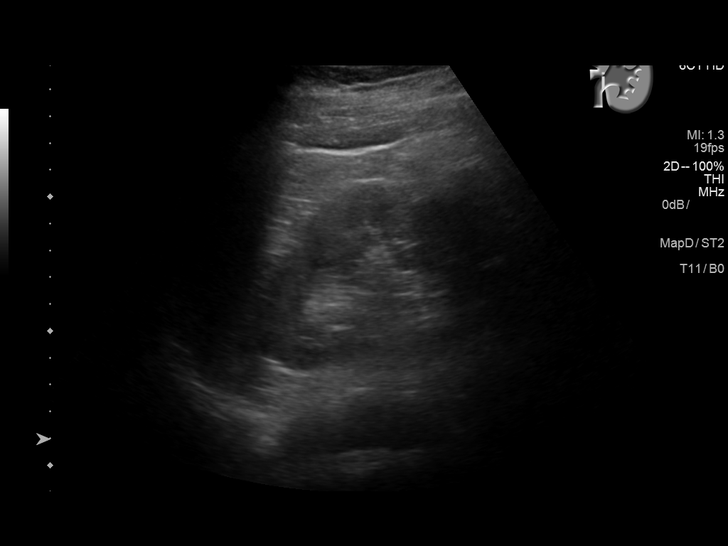
[im 22/33]
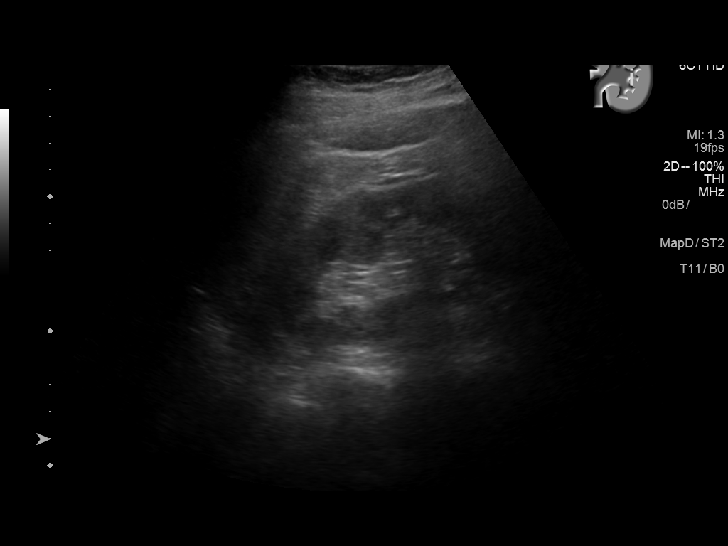
[im 25/33]
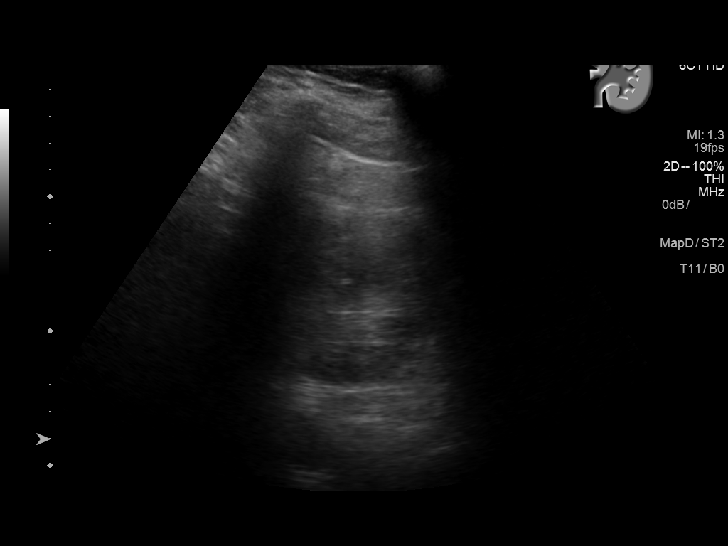
[im 27/33]
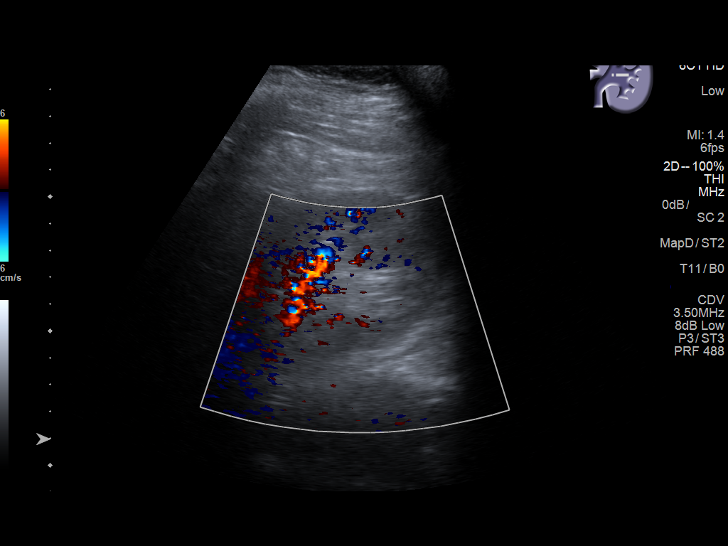
[im 30/33]
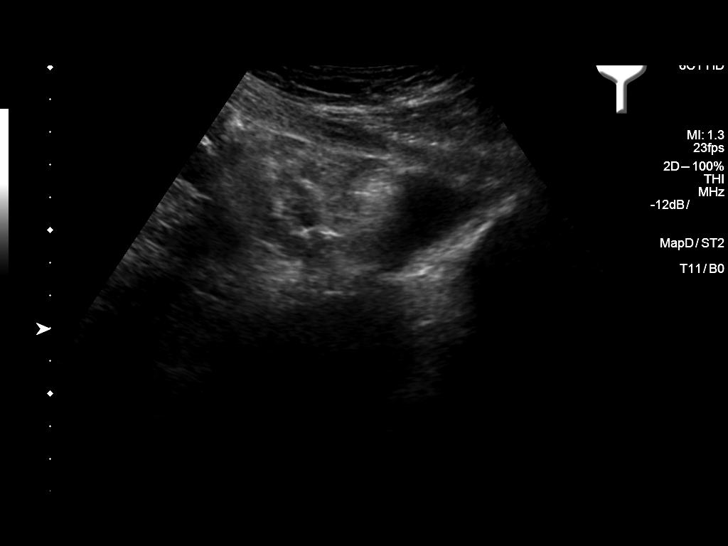
[im 33/33]
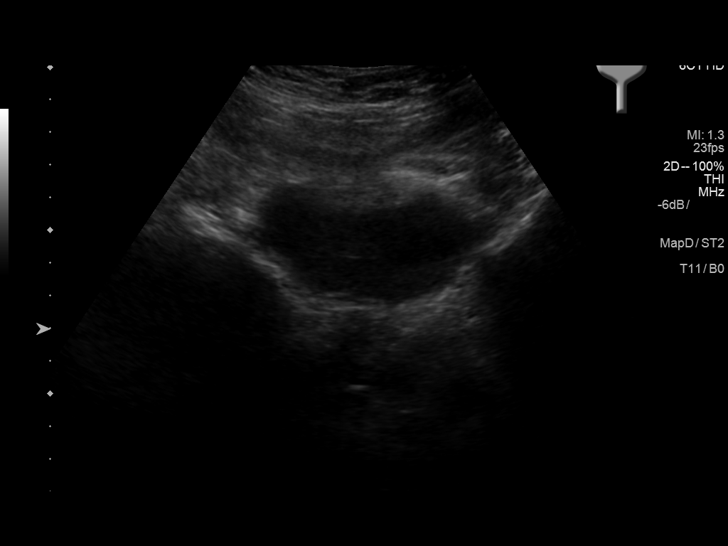

[14 of 25 positions shown; findings below may reference images not displayed]

FINDINGS: Right Kidney:

Length: 9.9 cm. The renal cortical echotexture remains lower than
that of the liver. There is no hydronephrosis. No cystic or solid
mass is observed. No stones are evident.

Left Kidney:

Length: 10.4 cm. The renal cortical echotexture is similar to that
on the right. There is no hydronephrosis. There is no cystic or
solid mass. No stones are demonstrated.

Bladder:

Appears normal for degree of bladder distention.
IMPRESSION: No acute abnormality of the kidneys or urinary bladder are observed.
If the patient's microhematuria persists, renal protocol MRI would
be useful to exclude occult lesions.

## 2018-08-13 DIAGNOSIS — M531 Cervicobrachial syndrome: Secondary | ICD-10-CM | POA: Diagnosis not present

## 2018-08-13 DIAGNOSIS — M9901 Segmental and somatic dysfunction of cervical region: Secondary | ICD-10-CM | POA: Diagnosis not present

## 2018-08-13 DIAGNOSIS — M530 Cervicocranial syndrome: Secondary | ICD-10-CM | POA: Diagnosis not present

## 2018-08-13 DIAGNOSIS — M9902 Segmental and somatic dysfunction of thoracic region: Secondary | ICD-10-CM | POA: Diagnosis not present

## 2018-09-08 DIAGNOSIS — M5386 Other specified dorsopathies, lumbar region: Secondary | ICD-10-CM | POA: Diagnosis not present

## 2018-09-08 DIAGNOSIS — M9902 Segmental and somatic dysfunction of thoracic region: Secondary | ICD-10-CM | POA: Diagnosis not present

## 2018-09-08 DIAGNOSIS — M9901 Segmental and somatic dysfunction of cervical region: Secondary | ICD-10-CM | POA: Diagnosis not present

## 2018-09-08 DIAGNOSIS — M531 Cervicobrachial syndrome: Secondary | ICD-10-CM | POA: Diagnosis not present

## 2018-09-20 DIAGNOSIS — J019 Acute sinusitis, unspecified: Secondary | ICD-10-CM | POA: Diagnosis not present

## 2018-10-22 DIAGNOSIS — M5387 Other specified dorsopathies, lumbosacral region: Secondary | ICD-10-CM | POA: Diagnosis not present

## 2018-10-22 DIAGNOSIS — M9902 Segmental and somatic dysfunction of thoracic region: Secondary | ICD-10-CM | POA: Diagnosis not present

## 2018-10-22 DIAGNOSIS — M9901 Segmental and somatic dysfunction of cervical region: Secondary | ICD-10-CM | POA: Diagnosis not present

## 2018-10-22 DIAGNOSIS — M531 Cervicobrachial syndrome: Secondary | ICD-10-CM | POA: Diagnosis not present

## 2018-11-17 DIAGNOSIS — M5386 Other specified dorsopathies, lumbar region: Secondary | ICD-10-CM | POA: Diagnosis not present

## 2018-11-17 DIAGNOSIS — M9903 Segmental and somatic dysfunction of lumbar region: Secondary | ICD-10-CM | POA: Diagnosis not present

## 2018-11-17 DIAGNOSIS — M9901 Segmental and somatic dysfunction of cervical region: Secondary | ICD-10-CM | POA: Diagnosis not present

## 2018-11-17 DIAGNOSIS — M436 Torticollis: Secondary | ICD-10-CM | POA: Diagnosis not present

## 2018-11-19 ENCOUNTER — Encounter: Payer: Self-pay | Admitting: Family Medicine

## 2018-11-19 ENCOUNTER — Other Ambulatory Visit: Payer: Self-pay

## 2018-11-19 ENCOUNTER — Ambulatory Visit (INDEPENDENT_AMBULATORY_CARE_PROVIDER_SITE_OTHER): Payer: BLUE CROSS/BLUE SHIELD | Admitting: Family Medicine

## 2018-11-19 VITALS — BP 138/86 | HR 74 | Temp 97.8°F | Ht 67.0 in | Wt 173.8 lb

## 2018-11-19 DIAGNOSIS — J0100 Acute maxillary sinusitis, unspecified: Secondary | ICD-10-CM | POA: Diagnosis not present

## 2018-11-19 MED ORDER — AMOXICILLIN-POT CLAVULANATE 875-125 MG PO TABS: 1.0000 | ORAL_TABLET | Freq: Two times a day (BID) | ORAL | 0 refills | Status: DC

## 2018-11-19 NOTE — Patient Instructions (Signed)
Continue decongestants and add an expectorant like Mucinex.

## 2018-11-19 NOTE — Progress Notes (Signed)
  Subjective:     Patient ID: Anne Montgomery, female   DOB: 07/31/1971, 48 y.o.   MRN: [id number] Chief Complaint  Patient presents with  . Sinusitis    since 11/11/18.  headache, face pain, cough, teeth pain, green mucus   HPI Patient reports increased sinus pressure, purulent sinus drainage, post nasal drainage and accompanying cough. Has been using Sudafed and steroid nasal spray for her sx.  Review of Systems     Objective:   Physical Exam Constitutional:      General: She is not in acute distress.    Appearance: She is ill-appearing.  Neurological:     Mental Status: She is alert.   Ears: T.M's intact without inflammation Sinuses: moderate maxillary sinus tenderness Throat: no tonsillar enlargement or exudate Neck: Enlarged left anterior cervical node Lungs: clear     Assessment:    1. Acute non-recurrent maxillary sinusitis - amoxicillin-clavulanate (AUGMENTIN) 875-125 MG tablet; Take 1 tablet by mouth 2 (two) times daily.  Dispense: 20 tablet; Refill: 0    Plan:    Continue Sudafed and add an expectorant.

## 2018-12-02 DIAGNOSIS — G43009 Migraine without aura, not intractable, without status migrainosus: Secondary | ICD-10-CM | POA: Diagnosis not present

## 2018-12-02 DIAGNOSIS — M9902 Segmental and somatic dysfunction of thoracic region: Secondary | ICD-10-CM | POA: Diagnosis not present

## 2018-12-02 DIAGNOSIS — M5386 Other specified dorsopathies, lumbar region: Secondary | ICD-10-CM | POA: Diagnosis not present

## 2018-12-02 DIAGNOSIS — M530 Cervicocranial syndrome: Secondary | ICD-10-CM | POA: Diagnosis not present

## 2019-01-02 DIAGNOSIS — M530 Cervicocranial syndrome: Secondary | ICD-10-CM | POA: Diagnosis not present

## 2019-01-02 DIAGNOSIS — M9902 Segmental and somatic dysfunction of thoracic region: Secondary | ICD-10-CM | POA: Diagnosis not present

## 2019-01-02 DIAGNOSIS — G43009 Migraine without aura, not intractable, without status migrainosus: Secondary | ICD-10-CM | POA: Diagnosis not present

## 2019-01-02 DIAGNOSIS — M531 Cervicobrachial syndrome: Secondary | ICD-10-CM | POA: Diagnosis not present

## 2019-01-26 DIAGNOSIS — M5387 Other specified dorsopathies, lumbosacral region: Secondary | ICD-10-CM | POA: Diagnosis not present

## 2019-01-26 DIAGNOSIS — M530 Cervicocranial syndrome: Secondary | ICD-10-CM | POA: Diagnosis not present

## 2019-01-26 DIAGNOSIS — M9902 Segmental and somatic dysfunction of thoracic region: Secondary | ICD-10-CM | POA: Diagnosis not present

## 2019-01-26 DIAGNOSIS — M531 Cervicobrachial syndrome: Secondary | ICD-10-CM | POA: Diagnosis not present

## 2019-02-19 DIAGNOSIS — Z01419 Encounter for gynecological examination (general) (routine) without abnormal findings: Secondary | ICD-10-CM | POA: Diagnosis not present

## 2019-02-19 DIAGNOSIS — Z1231 Encounter for screening mammogram for malignant neoplasm of breast: Secondary | ICD-10-CM | POA: Diagnosis not present

## 2019-02-19 DIAGNOSIS — Z6827 Body mass index (BMI) 27.0-27.9, adult: Secondary | ICD-10-CM | POA: Diagnosis not present

## 2019-02-23 ENCOUNTER — Other Ambulatory Visit: Payer: Self-pay | Admitting: Obstetrics & Gynecology

## 2019-02-23 DIAGNOSIS — M9901 Segmental and somatic dysfunction of cervical region: Secondary | ICD-10-CM | POA: Diagnosis not present

## 2019-02-23 DIAGNOSIS — M9902 Segmental and somatic dysfunction of thoracic region: Secondary | ICD-10-CM | POA: Diagnosis not present

## 2019-02-23 DIAGNOSIS — M531 Cervicobrachial syndrome: Secondary | ICD-10-CM | POA: Diagnosis not present

## 2019-02-23 DIAGNOSIS — R928 Other abnormal and inconclusive findings on diagnostic imaging of breast: Secondary | ICD-10-CM

## 2019-02-23 DIAGNOSIS — M9903 Segmental and somatic dysfunction of lumbar region: Secondary | ICD-10-CM | POA: Diagnosis not present

## 2019-02-24 ENCOUNTER — Ambulatory Visit
Admission: RE | Admit: 2019-02-24 | Discharge: 2019-02-24 | Disposition: A | Discharge status: Home / Self Care | Payer: BLUE CROSS/BLUE SHIELD | Source: Ambulatory Visit | Attending: Obstetrics & Gynecology | Admitting: Obstetrics & Gynecology

## 2019-02-24 ENCOUNTER — Other Ambulatory Visit: Payer: Self-pay

## 2019-02-24 DIAGNOSIS — R928 Other abnormal and inconclusive findings on diagnostic imaging of breast: Secondary | ICD-10-CM

## 2019-02-24 DIAGNOSIS — R922 Inconclusive mammogram: Secondary | ICD-10-CM | POA: Diagnosis not present

## 2019-04-06 DIAGNOSIS — M9902 Segmental and somatic dysfunction of thoracic region: Secondary | ICD-10-CM | POA: Diagnosis not present

## 2019-04-06 DIAGNOSIS — M9901 Segmental and somatic dysfunction of cervical region: Secondary | ICD-10-CM | POA: Diagnosis not present

## 2019-04-06 DIAGNOSIS — M531 Cervicobrachial syndrome: Secondary | ICD-10-CM | POA: Diagnosis not present

## 2019-04-06 DIAGNOSIS — M9903 Segmental and somatic dysfunction of lumbar region: Secondary | ICD-10-CM | POA: Diagnosis not present

## 2019-05-05 DIAGNOSIS — M531 Cervicobrachial syndrome: Secondary | ICD-10-CM | POA: Diagnosis not present

## 2019-05-05 DIAGNOSIS — M9901 Segmental and somatic dysfunction of cervical region: Secondary | ICD-10-CM | POA: Diagnosis not present

## 2019-05-05 DIAGNOSIS — M9902 Segmental and somatic dysfunction of thoracic region: Secondary | ICD-10-CM | POA: Diagnosis not present

## 2019-05-05 DIAGNOSIS — M5386 Other specified dorsopathies, lumbar region: Secondary | ICD-10-CM | POA: Diagnosis not present

## 2019-05-21 DIAGNOSIS — L988 Other specified disorders of the skin and subcutaneous tissue: Secondary | ICD-10-CM | POA: Diagnosis not present

## 2019-05-21 DIAGNOSIS — L821 Other seborrheic keratosis: Secondary | ICD-10-CM | POA: Diagnosis not present

## 2019-05-21 DIAGNOSIS — L570 Actinic keratosis: Secondary | ICD-10-CM | POA: Diagnosis not present

## 2019-05-21 DIAGNOSIS — D2239 Melanocytic nevi of other parts of face: Secondary | ICD-10-CM | POA: Diagnosis not present

## 2019-05-21 DIAGNOSIS — L578 Other skin changes due to chronic exposure to nonionizing radiation: Secondary | ICD-10-CM | POA: Diagnosis not present

## 2019-06-23 DIAGNOSIS — M5386 Other specified dorsopathies, lumbar region: Secondary | ICD-10-CM | POA: Diagnosis not present

## 2019-06-23 DIAGNOSIS — M531 Cervicobrachial syndrome: Secondary | ICD-10-CM | POA: Diagnosis not present

## 2019-06-23 DIAGNOSIS — M9902 Segmental and somatic dysfunction of thoracic region: Secondary | ICD-10-CM | POA: Diagnosis not present

## 2019-06-23 DIAGNOSIS — M9901 Segmental and somatic dysfunction of cervical region: Secondary | ICD-10-CM | POA: Diagnosis not present

## 2019-07-12 IMAGING — US ULTRASOUND RIGHT BREAST LIMITED
1 series · 7 of 7 positions shown · non-contrast
Comparison: Previous exam(s).

CLINICAL DATA: Screening recall for a right breast asymmetry.

EXAM:
DIGITAL DIAGNOSTIC RIGHT MAMMOGRAM WITH CAD AND TOMO
ULTRASOUND RIGHT BREAST

[Series 1: ultrasound right breast limited · 0.06mm/px · 7 of 7 slices shown]
[im 1/7]
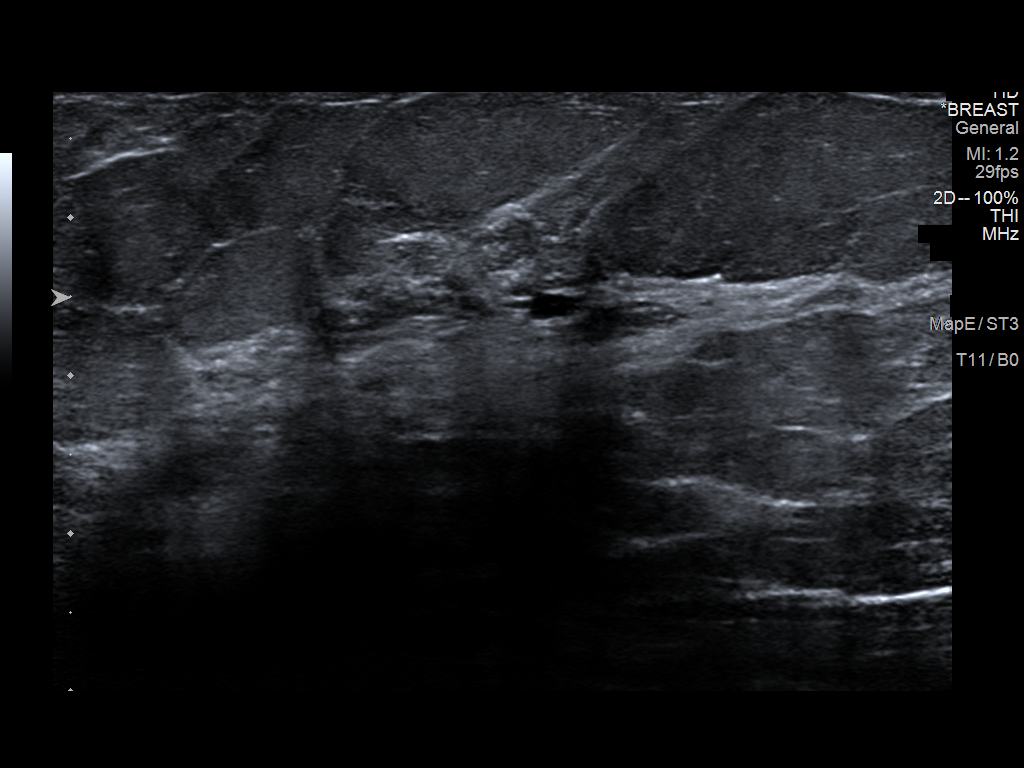
[im 2/7]
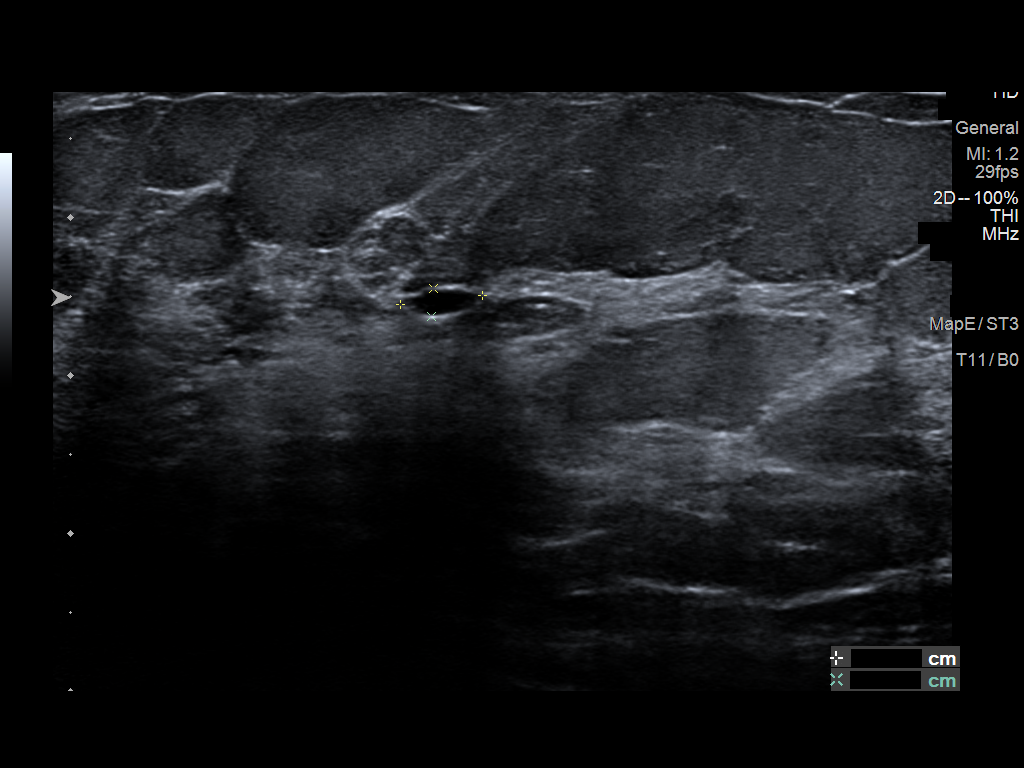
[im 3/7]
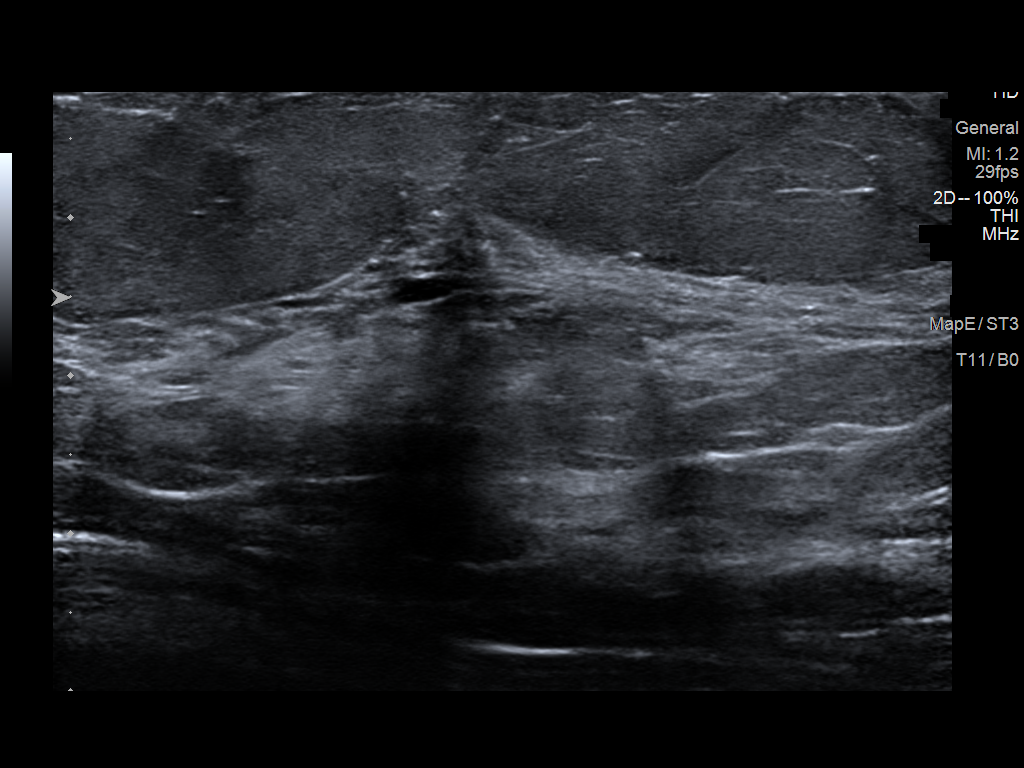
[im 4/7]
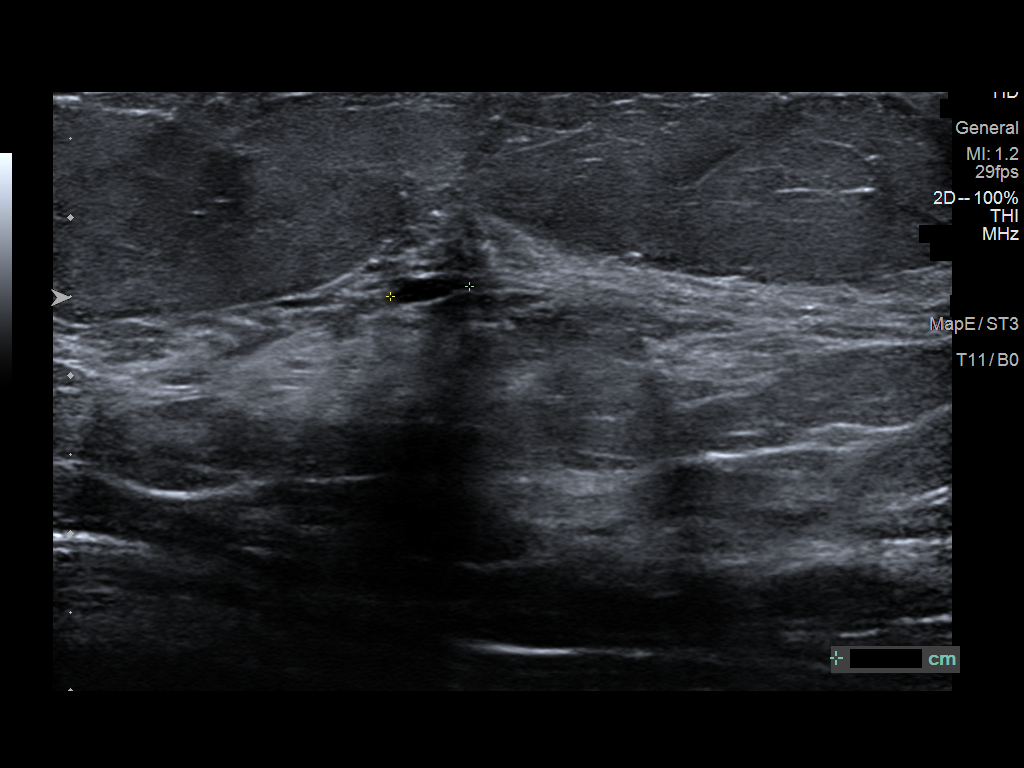
[im 5/7]
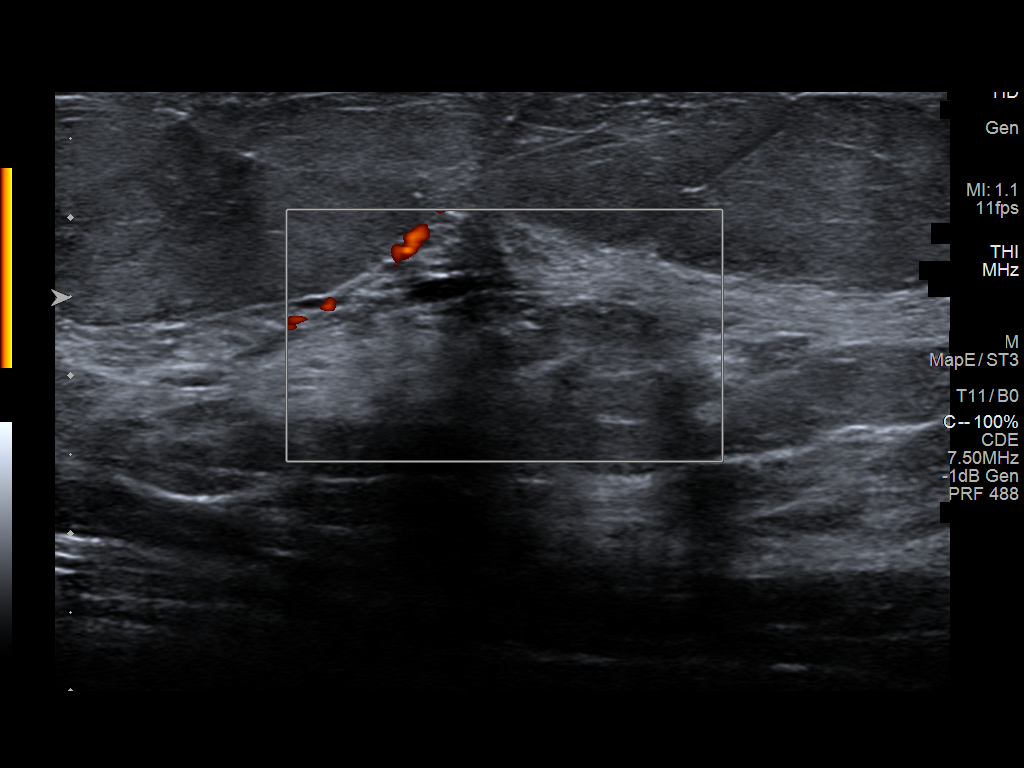
[im 6/7]
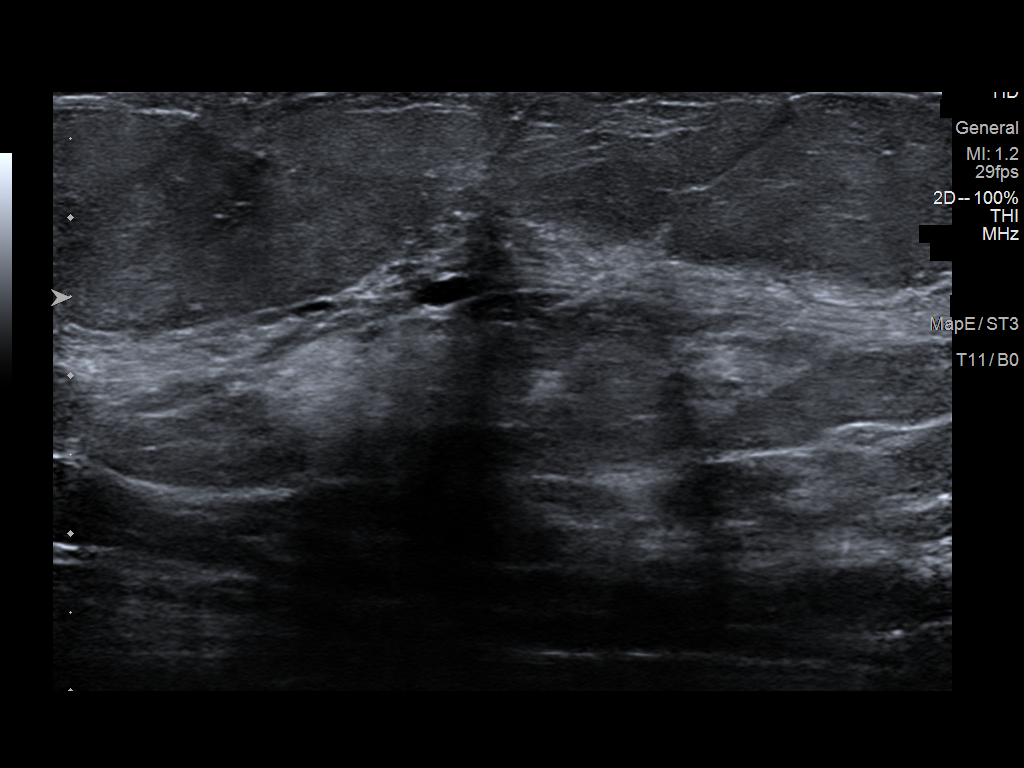
[im 7/7]
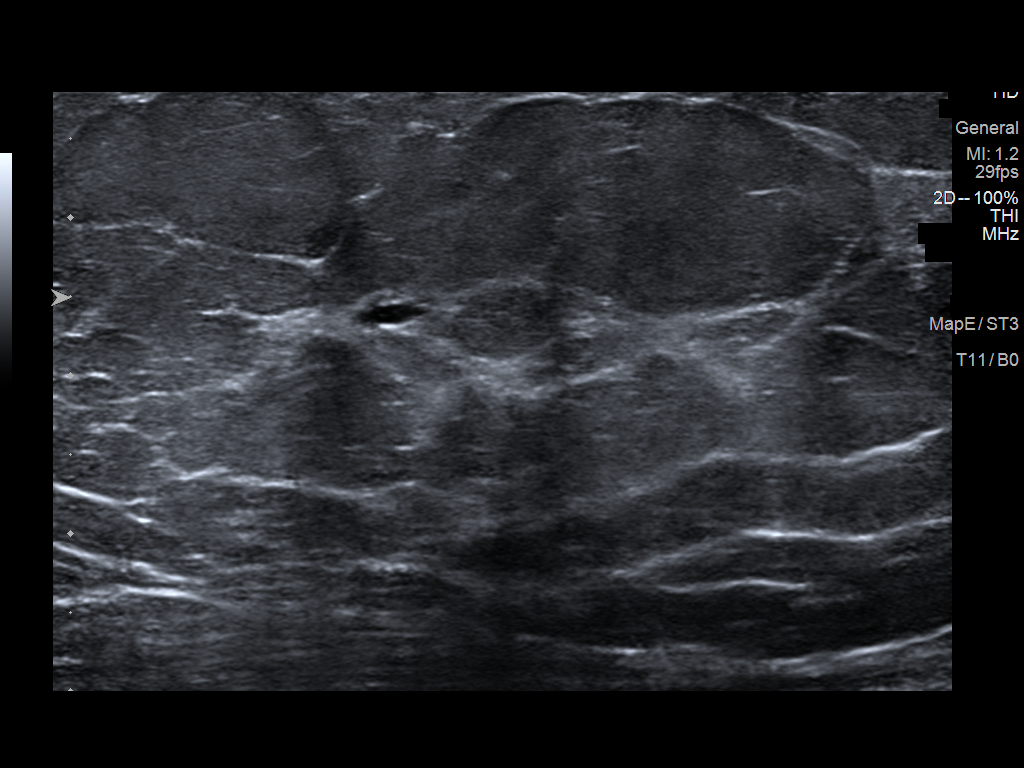

[7 of 7 positions shown; findings below may reference images not displayed]

ACR Breast Density Category b: There are scattered areas of
fibroglandular density.
FINDINGS: In the upper slightly outer quadrant of the right breast, posterior
depth there is a subtle asymmetry which on the spot compression
tomosynthesis images has a similar appearance to the patient's prior
mammograms. There may be a small obscured 5-6 mm mass in this
location. Ultrasound will be performed for further evaluation.

Mammographic images were processed with CAD.

Ultrasound of the right breast at 11 o'clock, 5 cm from the nipple
demonstrates an anechoic oval circumscribed mass measuring 5 x 2 x 5
mm. Another small cyst is seen in the right breast at 12 o'clock.
IMPRESSION: 1. The asymmetry in the superior right breast corresponds with a
small benign cyst. No mammographic or targeted sonographic findings
in the right breast suspicious of breast cancer.

RECOMMENDATION:
Screening mammogram in one year.(Code:MY-3-RHW)

I have discussed the findings and recommendations with the patient.
Results were also provided in writing at the conclusion of the
visit. If applicable, a reminder letter will be sent to the patient
regarding the next appointment.

BI-RADS CATEGORY  2: Benign.

## 2019-08-03 DIAGNOSIS — M9903 Segmental and somatic dysfunction of lumbar region: Secondary | ICD-10-CM | POA: Diagnosis not present

## 2019-08-03 DIAGNOSIS — M5386 Other specified dorsopathies, lumbar region: Secondary | ICD-10-CM | POA: Diagnosis not present

## 2019-08-03 DIAGNOSIS — M9902 Segmental and somatic dysfunction of thoracic region: Secondary | ICD-10-CM | POA: Diagnosis not present

## 2019-08-12 DIAGNOSIS — L988 Other specified disorders of the skin and subcutaneous tissue: Secondary | ICD-10-CM | POA: Diagnosis not present

## 2019-08-12 DIAGNOSIS — L821 Other seborrheic keratosis: Secondary | ICD-10-CM | POA: Diagnosis not present

## 2019-08-12 DIAGNOSIS — Z872 Personal history of diseases of the skin and subcutaneous tissue: Secondary | ICD-10-CM | POA: Diagnosis not present

## 2019-08-12 DIAGNOSIS — L570 Actinic keratosis: Secondary | ICD-10-CM | POA: Diagnosis not present

## 2020-02-17 ENCOUNTER — Encounter: Payer: BLUE CROSS/BLUE SHIELD | Admitting: Dermatology

## 2020-08-01 ENCOUNTER — Encounter: Payer: Self-pay | Admitting: Dermatology

## 2020-08-01 ENCOUNTER — Ambulatory Visit (INDEPENDENT_AMBULATORY_CARE_PROVIDER_SITE_OTHER): Payer: Self-pay | Admitting: Dermatology

## 2020-08-01 ENCOUNTER — Other Ambulatory Visit: Payer: Self-pay

## 2020-08-01 DIAGNOSIS — L578 Other skin changes due to chronic exposure to nonionizing radiation: Secondary | ICD-10-CM

## 2020-08-01 DIAGNOSIS — L570 Actinic keratosis: Secondary | ICD-10-CM

## 2020-08-01 NOTE — Progress Notes (Signed)
   Follow-Up Visit   Subjective  Anne Montgomery is a 49 y.o. female who presents for the following: Other (Spot of upper lip that was treated with LN2 last year has come back).  The following portions of the chart were reviewed this encounter and updated as appropriate:  Tobacco  Allergies  Meds  Problems  Med Hx  Surg Hx  Fam Hx     Review of Systems:  No other skin or systemic complaints except as noted in HPI or Assessment and Plan.  Objective  Well appearing patient in no apparent distress; mood and affect are within normal limits.  A focused examination was performed including face. Relevant physical exam findings are noted in the Assessment and Plan.  Objective  Right medial upper lip: Erythematous thin papules/macules with gritty scale.    Assessment & Plan    Actinic Damage - diffuse scaly erythematous macules with underlying dyspigmentation - Recommend daily broad spectrum sunscreen SPF 30+ to sun-exposed areas, reapply every 2 hours as needed.  - Call for new or changing lesions.   AK (actinic keratosis) Right medial upper lip  Will plan topical treatment on follow up  Destruction of lesion - Right medial upper lip Complexity: simple   Destruction method: cryotherapy   Informed consent: discussed and consent obtained   Timeout:  patient name, date of birth, surgical site, and procedure verified Lesion destroyed using liquid nitrogen: Yes   Region frozen until ice ball extended beyond lesion: Yes   Outcome: patient tolerated procedure well with no complications   Post-procedure details: wound care instructions given    Return in about 2 months (around 10/01/2020).  I, Ashok Cordia, CMA, am acting as scribe for Sarina Ser, MD . Documentation: I have reviewed the above documentation for accuracy and completeness, and I agree with the above.  Sarina Ser, MD

## 2020-08-01 NOTE — Patient Instructions (Signed)

## 2020-10-31 ENCOUNTER — Ambulatory Visit: Payer: Self-pay | Admitting: Dermatology

## 2020-12-14 ENCOUNTER — Ambulatory Visit: Payer: BLUE CROSS/BLUE SHIELD | Admitting: Adult Health

## 2020-12-16 NOTE — Patient Instructions (Addendum)
Call to schedule your screening mammogram. Your orders have been placed for your exam.  Let our office know if you have questions, concerns, or any difficulty scheduling.  If normal results then yearly screening mammograms are recommended unless you notice  Changes in your breast then you should schedule a follow up office visit. If abnormal results  Further imaging will be warranted and sooner follow up as determined by the radiologist at the [facility].   [facility] [address], [place] [address]  Main: [phone]      [email or web address]  Colorectal Cancer Screening  Colorectal cancer screening is a group of tests that are used to check for colorectal cancer before symptoms develop. Colorectal refers to the colon and rectum. The colon and rectum are located at the end of the digestive tract and carry stool (feces) out of the body. Who should have screening? All adults who are 65-31 years old should have screening. Your health care provider may recommend screening before age 5. You will have tests every 1-10 years, depending on your results and the type of screening test. Screening recommendations for adults who are 38-22 years old vary depending on a person's health. People older than age 49 should no longer get colorectal cancer screening. You may have screening tests starting before age 16, or more often than other people, if you have any of these risk factors:  A personal or family history of colorectal cancer or abnormal growths known as polyps in your colon.  Inflammatory bowel disease, such as ulcerative colitis or Crohn's disease.  A history of having radiation treatment to the abdomen or the area between the hip bones (pelvic area) for cancer.  A type of genetic syndrome that is passed from parent to child (hereditary), such as: ? Lynch  syndrome. ? Familial adenomatous polyposis. ? Turcot syndrome. ? Peutz-Jeghers syndrome. ? MUTYH-associated polyposis (MAP).  A personal history of diabetes. Types of tests There are several types of colorectal screening tests. You may have one or more of the following:  Guaiac-based fecal occult blood testing. For this test, a stool sample is checked for hidden (occult) blood, which could be a sign of colorectal cancer.  Fecal immunochemical test (FIT). For this test, a stool sample is checked for blood, which could be a sign of colorectal cancer.  Stool DNA test. For this test, a stool sample is checked for blood and changes in DNA that could lead to colorectal cancer.  Sigmoidoscopy. During this test, a thin, flexible tube with a camera on the end, called a sigmoidoscope, is used to examine the rectum and the lower colon.  Colonoscopy. During this test, a long, flexible tube with a camera on the end, called a colonoscope, is used to examine the entire colon and rectum. Also, sometimes a tissue sample is taken to be looked at under a microscope (biopsy) or small polyps are removed during this test.  Virtual colonoscopy. Instead of a colonoscope, this type of colonoscopy uses a CT scan to take pictures of the colon and rectum. A CT scan is a type of X-ray that is made using computers. What are the benefits of screening? Screening reduces your risk for colorectal cancer and can help identify cancer at an early stage, when the cancer can be removed or treated more easily. It is common for polyps to form in the lining of the colon, especially as you age. These polyps may be cancerous or become  cancerous over time. Screening can identify these polyps. What are the risks of screening? Generally, these are safe tests. However, problems may occur, including:  The need for more tests to confirm results from a stool sample test. Stool sample tests have fewer risks than other types of screening  tests.  Being exposed to low levels of radiation, if you had a test involving X-rays. This may slightly increase your cancer risk. The benefit of detecting cancer outweighs the slight increase in risk.  Bleeding, damage to the intestine, or infection caused by a sigmoidoscopy or colonoscopy.  A reaction to medicines given during a sigmoidoscopy or colonoscopy. Talk with your health care provider to understand your risk for colorectal cancer and to make a screening plan that is right for you. Questions to ask your health care provider  When should I start colorectal cancer screening?  What is my risk for colorectal cancer?  How often do I need screening?  Which screening tests do I need?  How do I get my test results?  What do my results mean? Where to find more information Learn more about colorectal cancer screening from:  The American Cancer Society: cancer.[place]: cancer.gov Summary  Colorectal cancer screening is a group of tests used to check for colorectal cancer before symptoms develop.  All adults who are 9-59 years old should have screening. Your health care provider may recommend screening before age 19.  You may have screening tests starting before age 47, or more often than other people, if you have certain risk factors.  Screening reduces your risk for colorectal cancer and can help identify cancer at an early stage, when the cancer can be removed or treated more easily.  Talk with your health care provider to understand your risk for colorectal cancer and to make a screening plan that is right for you. This information is not intended to replace advice given to you by your health care provider. Make sure you discuss any questions you have with your health care provider. Document Revised: 01/20/2020 Document Reviewed: 01/20/2020 Elsevier Patient Education  Frannie Maintenance, Female Adopting a healthy lifestyle and  getting preventive care are important in promoting health and wellness. Ask your health care provider about:  The right schedule for you to have regular tests and exams.  Things you can do on your own to prevent diseases and keep yourself healthy. What should I know about diet, weight, and exercise? Eat a healthy diet  Eat a diet that includes plenty of vegetables, fruits, low-fat dairy products, and lean protein.  Do not eat a lot of foods that are high in solid fats, added sugars, or sodium.   Maintain a healthy weight Body mass index (BMI) is used to identify weight problems. It estimates body fat based on height and weight. Your health care provider can help determine your BMI and help you achieve or maintain a healthy weight. Get regular exercise Get regular exercise. This is one of the most important things you can do for your health. Most adults should:  Exercise for at least 150 minutes each week. The exercise should increase your heart rate and make you sweat (moderate-intensity exercise).  Do strengthening exercises at least twice a week. This is in addition to the moderate-intensity exercise.  Spend less time sitting. Even light physical activity can be beneficial. Watch cholesterol and blood lipids Have your blood tested for lipids and cholesterol at 52 years of age, then have  this test every 5 years. Have your cholesterol levels checked more often if:  Your lipid or cholesterol levels are high.  You are older than 52 years of age.  You are at high risk for heart disease. What should I know about cancer screening? Depending on your health history and family history, you may need to have cancer screening at various ages. This may include screening for:  Breast cancer.  Cervical cancer.  Colorectal cancer.  Skin cancer.  Lung cancer. What should I know about heart disease, diabetes, and high blood pressure? Blood pressure and heart disease  High blood pressure  causes heart disease and increases the risk of stroke. This is more likely to develop in people who have high blood pressure readings, are of African descent, or are overweight.  Have your blood pressure checked: ? Every 3-5 years if you are 52-54 years of age. ? Every year if you are 22 years old or older. Diabetes Have regular diabetes screenings. This checks your fasting blood sugar level. Have the screening done:  Once every three years after age 29 if you are at a normal weight and have a low risk for diabetes.  More often and at a younger age if you are overweight or have a high risk for diabetes. What should I know about preventing infection? Hepatitis B If you have a higher risk for hepatitis B, you should be screened for this virus. Talk with your health care provider to find out if you are at risk for hepatitis B infection. Hepatitis C Testing is recommended for:  Everyone born from [birth year] through 1967.  Anyone with known risk factors for hepatitis C. Sexually transmitted infections (STIs)  Get screened for STIs, including gonorrhea and chlamydia, if: ? You are sexually active and are younger than 52 years of age. ? You are older than 52 years of age and your health care provider tells you that you are at risk for this type of infection. ? Your sexual activity has changed since you were last screened, and you are at increased risk for chlamydia or gonorrhea. Ask your health care provider if you are at risk.  Ask your health care provider about whether you are at high risk for HIV. Your health care provider may recommend a prescription medicine to help prevent HIV infection. If you choose to take medicine to prevent HIV, you should first get tested for HIV. You should then be tested every 3 months for as long as you are taking the medicine. Pregnancy  If you are about to stop having your period (premenopausal) and you may become pregnant, seek counseling before you get  pregnant.  Take 400 to 800 micrograms (mcg) of folic acid every day if you become pregnant.  Ask for birth control (contraception) if you want to prevent pregnancy. Osteoporosis and menopause Osteoporosis is a disease in which the bones lose minerals and strength with aging. This can result in bone fractures. If you are 51 years old or older, or if you are at risk for osteoporosis and fractures, ask your health care provider if you should:  Be screened for bone loss.  Take a calcium or vitamin D supplement to lower your risk of fractures.  Be given hormone replacement therapy (HRT) to treat symptoms of menopause. Follow these instructions at home: Lifestyle  Do not use any products that contain nicotine or tobacco, such as cigarettes, e-cigarettes, and chewing tobacco. If you need help quitting, ask your health care provider.  Do not use street drugs.  Do not share needles.  Ask your health care provider for help if you need support or information about quitting drugs. Alcohol use  Do not drink alcohol if: ? Your health care provider tells you not to drink. ? You are pregnant, may be pregnant, or are planning to become pregnant.  If you drink alcohol: ? Limit how much you use to 0-1 drink a day. ? Limit intake if you are breastfeeding.  Be aware of how much alcohol is in your drink. In the [place] one drink equals one 12 oz bottle of beer (355 mL), one 5 oz glass of wine (148 mL), or one 1 oz glass of hard liquor (44 mL). General instructions  Schedule regular health, dental, and eye exams.  Stay current with your vaccines.  Tell your health care provider if: ? You often feel depressed. ? You have ever been abused or do not feel safe at home. Summary  Adopting a healthy lifestyle and getting preventive care are important in promoting health and wellness.  Follow your health care provider's instructions about healthy diet, exercising, and getting tested or screened for  diseases.  Follow your health care provider's instructions on monitoring your cholesterol and blood pressure. This information is not intended to replace advice given to you by your health care provider. Make sure you discuss any questions you have with your health care provider. Document Revised: 09/24/2018 Document Reviewed: 09/24/2018 Elsevier Patient Education  2021 [place] for [employer] Calories are units of energy. Your body needs a certain number of calories from food to keep going throughout the day. When you eat or drink more calories than your body needs, your body stores the extra calories mostly as fat. When you eat or drink fewer calories than your body needs, your body burns fat to get the energy it needs. Calorie counting means keeping track of how many calories you eat and drink each day. Calorie counting can be helpful if you need to lose weight. If you eat fewer calories than your body needs, you should lose weight. Ask your health care provider what a healthy weight is for you. For calorie counting to work, you will need to eat the right number of calories each day to lose a healthy amount of weight per week. A dietitian can help you figure out how many calories you need in a day and will suggest ways to reach your calorie goal.  A healthy amount of weight to lose each week is usually 1-2 lb (0.5-0.9 kg). This usually means that your daily calorie intake should be reduced by 500-750 calories.  Eating 1,200-1,500 calories a day can help most women lose weight.  Eating 1,500-1,800 calories a day can help most men lose weight. What do I need to know about calorie counting? Work with your health care provider or dietitian to determine how many calories you should get each day. To meet your daily calorie goal, you will need to:  Find out how many calories are in each food that you would like to eat. Try to do this before you eat.  Decide how much of  the food you plan to eat.  Keep a food log. Do this by writing down what you ate and how many calories it had. To successfully lose weight, it is important to balance calorie counting with a healthy lifestyle that includes regular activity. Where do I find calorie information? The number of  calories in a food can be found on a Nutrition Facts label. If a food does not have a Nutrition Facts label, try to look up the calories online or ask your dietitian for help. Remember that calories are listed per serving. If you choose to have more than one serving of a food, you will have to multiply the calories per serving by the number of servings you plan to eat. For example, the label on a package of bread might say that a serving size is 1 slice and that there are 90 calories in a serving. If you eat 1 slice, you will have eaten 90 calories. If you eat 2 slices, you will have eaten 180 calories.   How do I keep a food log? After each time that you eat, record the following in your food log as soon as possible:  What you ate. Be sure to include toppings, sauces, and other extras on the food.  How much you ate. This can be measured in cups, ounces, or number of items.  How many calories were in each food and drink.  The total number of calories in the food you ate. Keep your food log near you, such as in a pocket-sized notebook or on an app or website on your mobile phone. Some programs will calculate calories for you and show you how many calories you have left to meet your daily goal. What are some portion-control tips?  Know how many calories are in a serving. This will help you know how many servings you can have of a certain food.  Use a measuring cup to measure serving sizes. You could also try weighing out portions on a kitchen scale. With time, you will be able to estimate serving sizes for some foods.  Take time to put servings of different foods on your favorite plates or in your favorite  bowls and cups so you know what a serving looks like.  Try not to eat straight from a food's packaging, such as from a bag or box. Eating straight from the package makes it hard to see how much you are eating and can lead to overeating. Put the amount you would like to eat in a cup or on a plate to make sure you are eating the right portion.  Use smaller plates, glasses, and bowls for smaller portions and to prevent overeating.  Try not to multitask. For example, avoid watching TV or using your computer while eating. If it is time to eat, sit down at a table and enjoy your food. This will help you recognize when you are full. It will also help you be more mindful of what and how much you are eating. What are tips for following this plan? Reading food labels  Check the calorie count compared with the serving size. The serving size may be smaller than what you are used to eating.  Check the source of the calories. Try to choose foods that are high in protein, fiber, and vitamins, and low in saturated fat, trans fat, and sodium. Shopping  Read nutrition labels while you shop. This will help you make healthy decisions about which foods to buy.  Pay attention to nutrition labels for low-fat or fat-free foods. These foods sometimes have the same number of calories or more calories than the full-fat versions. They also often have added sugar, starch, or salt to make up for flavor that was removed with the fat.  Make a grocery list of lower-calorie  foods and stick to it. Cooking  Try to cook your favorite foods in a healthier way. For example, try baking instead of frying.  Use low-fat dairy products. Meal planning  Use more fruits and vegetables. One-half of your plate should be fruits and vegetables.  Include lean proteins, such as chicken, [place], and fish. Lifestyle Each week, aim to do one of the following:  150 minutes of moderate exercise, such as walking.  75 minutes of vigorous  exercise, such as running. General information  Know how many calories are in the foods you eat most often. This will help you calculate calorie counts faster.  Find a way of tracking calories that works for you. Get creative. Try different apps or programs if writing down calories does not work for you. What foods should I eat?  Eat nutritious foods. It is better to have a nutritious, high-calorie food, such as an avocado, than a food with few nutrients, such as a bag of potato chips.  Use your calories on foods and drinks that will fill you up and will not leave you hungry soon after eating. ? Examples of foods that fill you up are nuts and nut butters, vegetables, lean proteins, and high-fiber foods such as whole grains. High-fiber foods are foods with more than 5 g of fiber per serving.  Pay attention to calories in drinks. Low-calorie drinks include water and unsweetened drinks. The items listed above may not be a complete list of foods and beverages you can eat. Contact a dietitian for more information.   What foods should I limit? Limit foods or drinks that are not good sources of vitamins, minerals, or protein or that are high in unhealthy fats. These include:  Candy.  Other sweets.  Sodas, specialty coffee drinks, alcohol, and juice. The items listed above may not be a complete list of foods and beverages you should avoid. Contact a dietitian for more information. How do I count calories when eating out?  Pay attention to portions. Often, portions are much larger when eating out. Try these tips to keep portions smaller: ? Consider sharing a meal instead of getting your own. ? If you get your own meal, eat only half of it. Before you start eating, ask for a container and put half of your meal into it. ? When available, consider ordering smaller portions from the menu instead of full portions.  Pay attention to your food and drink choices. Knowing the way food is cooked and what  is included with the meal can help you eat fewer calories. ? If calories are listed on the menu, choose the lower-calorie options. ? Choose dishes that include vegetables, fruits, whole grains, low-fat dairy products, and lean proteins. ? Choose items that are boiled, broiled, grilled, or steamed. Avoid items that are buttered, battered, fried, or served with cream sauce. Items labeled as crispy are usually fried, unless stated otherwise. ? Choose water, low-fat milk, unsweetened iced tea, or other drinks without added sugar. If you want an alcoholic beverage, choose a lower-calorie option, such as a glass of wine or light beer. ? Ask for dressings, sauces, and syrups on the side. These are usually high in calories, so you should limit the amount you eat. ? If you want a salad, choose a garden salad and ask for grilled meats. Avoid extra toppings such as bacon, cheese, or fried items. Ask for the dressing on the side, or ask for olive oil and vinegar or lemon to use as  dressing.  Estimate how many servings of a food you are given. Knowing serving sizes will help you be aware of how much food you are eating at restaurants. Where to find more information  Centers for Disease Control and Prevention: http://www.wolf.info/  U.[place]. Department of Agriculture: http://www.wilson-mendoza.org/ Summary  Calorie counting means keeping track of how many calories you eat and drink each day. If you eat fewer calories than your body needs, you should lose weight.  A healthy amount of weight to lose per week is usually 1-2 lb (0.5-0.9 kg). This usually means reducing your daily calorie intake by 500-750 calories.  The number of calories in a food can be found on a Nutrition Facts label. If a food does not have a Nutrition Facts label, try to look up the calories online or ask your dietitian for help.  Use smaller plates, glasses, and bowls for smaller portions and to prevent overeating.  Use your calories on foods and drinks that will fill  you up and not leave you hungry shortly after a meal. This information is not intended to replace advice given to you by your health care provider. Make sure you discuss any questions you have with your health care provider. Document Revised: 11/12/2019 Document Reviewed: 11/12/2019 Elsevier Patient Education  2021 [place] and Cholesterol Restricted Eating Plan Getting too much fat and cholesterol in your diet may cause health problems. Choosing the right foods helps keep your fat and cholesterol at normal levels. This can keep you from getting certain diseases. Your doctor may recommend an eating plan that includes:  Total fat: ______% or less of total calories a day.  Saturated fat: ______% or less of total calories a day.  Cholesterol: less than _________mg a day.  Fiber: ______g a day. What are tips for following this plan? Meal planning  At meals, divide your plate into four equal parts: ? Fill one-half of your plate with vegetables and green salads. ? Fill one-fourth of your plate with whole grains. ? Fill one-fourth of your plate with low-fat (lean) protein foods.  Eat fish that is high in omega-3 fats at least two times a week. This includes mackerel, tuna, sardines, and salmon.  Eat foods that are high in fiber, such as whole grains, beans, apples, broccoli, carrots, peas, and barley. General tips  Work with your doctor to lose weight if you need to.  Avoid: ? Foods with added sugar. ? Fried foods. ? Foods with partially hydrogenated oils.  Limit alcohol intake to no more than 1 drink a day for nonpregnant women and 2 drinks a day for men. One drink equals 12 oz of beer, 5 oz of wine, or 1 oz of hard liquor.   Reading food labels  Check food labels for: ? Trans fats. ? Partially hydrogenated oils. ? Saturated fat (g) in each serving. ? Cholesterol (mg) in each serving. ? Fiber (g) in each serving.  Choose foods with healthy fats, such  as: ? Monounsaturated fats. ? Polyunsaturated fats. ? Omega-3 fats.  Choose grain products that have whole grains. Look for the word "whole" as the first word in the ingredient list. Cooking  Cook foods using low-fat methods. These include baking, boiling, grilling, and broiling.  Eat more home-cooked foods. Eat at restaurants and buffets less often.  Avoid cooking using saturated fats, such as butter, cream, palm oil, palm kernel oil, and coconut oil. Recommended foods Fruits  All fresh, canned (in natural juice), or frozen fruits.  Vegetables  Fresh or frozen vegetables (raw, steamed, roasted, or grilled). Green salads. Grains  Whole grains, such as whole wheat or whole grain breads, crackers, cereals, and pasta. Unsweetened oatmeal, bulgur, barley, quinoa, or brown rice. Corn or whole wheat flour tortillas. Meats and other protein foods  Ground beef (85% or leaner), grass-fed beef, or beef trimmed of fat. Skinless chicken or [place]. Ground chicken or [place]. Pork trimmed of fat. All fish and seafood. Egg whites. Dried beans, peas, or lentils. Unsalted nuts or seeds. Unsalted canned beans. Nut butters without added sugar or oil. Dairy  Low-fat or nonfat dairy products, such as skim or 1% milk, 2% or reduced-fat cheeses, low-fat and fat-free ricotta or cottage cheese, or plain low-fat and nonfat yogurt. Fats and oils  Tub margarine without trans fats. Light or reduced-fat mayonnaise and salad dressings. Avocado. Olive, canola, sesame, or safflower oils. The items listed above may not be a complete list of foods and beverages you can eat. Contact a dietitian for more information.   Foods to avoid Fruits  Canned fruit in heavy syrup. Fruit in cream or butter sauce. Fried fruit. Vegetables  Vegetables cooked in cheese, cream, or butter sauce. Fried vegetables. Grains  White bread. White pasta. White rice. Cornbread. Bagels, pastries, and croissants. Crackers and snack foods  that contain trans fat and hydrogenated oils. Meats and other protein foods  Fatty cuts of meat. Ribs, chicken wings, bacon, sausage, bologna, salami, chitterlings, fatback, hot dogs, bratwurst, and packaged lunch meats. Liver and organ meats. Whole eggs and egg yolks. Chicken and [place] with skin. Fried meat. Dairy  Whole or 2% milk, cream, half-and-half, and cream cheese. Whole milk cheeses. Whole-fat or sweetened yogurt. Full-fat cheeses. Nondairy creamers and whipped toppings. Processed cheese, cheese spreads, and cheese curds. Beverages  Alcohol. Sugar-sweetened drinks such as sodas, lemonade, and fruit drinks. Fats and oils  Butter, stick margarine, lard, shortening, ghee, or bacon fat. Coconut, palm kernel, and palm oils. Sweets and desserts  Corn syrup, sugars, honey, and molasses. Candy. Jam and jelly. Syrup. Sweetened cereals. Cookies, pies, cakes, donuts, muffins, and ice cream. The items listed above may not be a complete list of foods and beverages you should avoid. Contact a dietitian for more information. Summary  Choosing the right foods helps keep your fat and cholesterol at normal levels. This can keep you from getting certain diseases.  At meals, fill one-half of your plate with vegetables and green salads.  Eat high-fiber foods, like whole grains, beans, apples, carrots, peas, and barley.  Limit added sugar, saturated fats, alcohol, and fried foods. This information is not intended to replace advice given to you by your health care provider. Make sure you discuss any questions you have with your health care provider. Document Revised: 02/03/2020 Document Reviewed: 02/03/2020 Elsevier Patient Education  2021 [place] for [employer] Calories are units of energy. Your body needs a certain number of calories from food to keep going throughout the day. When you eat or drink more calories than your body needs, your body stores the extra calories  mostly as fat. When you eat or drink fewer calories than your body needs, your body burns fat to get the energy it needs. Calorie counting means keeping track of how many calories you eat and drink each day. Calorie counting can be helpful if you need to lose weight. If you eat fewer calories than your body needs, you should lose weight. Ask your health care provider what  a healthy weight is for you. For calorie counting to work, you will need to eat the right number of calories each day to lose a healthy amount of weight per week. A dietitian can help you figure out how many calories you need in a day and will suggest ways to reach your calorie goal.  A healthy amount of weight to lose each week is usually 1-2 lb (0.5-0.9 kg). This usually means that your daily calorie intake should be reduced by 500-750 calories.  Eating 1,200-1,500 calories a day can help most women lose weight.  Eating 1,500-1,800 calories a day can help most men lose weight. What do I need to know about calorie counting? Work with your health care provider or dietitian to determine how many calories you should get each day. To meet your daily calorie goal, you will need to:  Find out how many calories are in each food that you would like to eat. Try to do this before you eat.  Decide how much of the food you plan to eat.  Keep a food log. Do this by writing down what you ate and how many calories it had. To successfully lose weight, it is important to balance calorie counting with a healthy lifestyle that includes regular activity. Where do I find calorie information? The number of calories in a food can be found on a Nutrition Facts label. If a food does not have a Nutrition Facts label, try to look up the calories online or ask your dietitian for help. Remember that calories are listed per serving. If you choose to have more than one serving of a food, you will have to multiply the calories per serving by the number of  servings you plan to eat. For example, the label on a package of bread might say that a serving size is 1 slice and that there are 90 calories in a serving. If you eat 1 slice, you will have eaten 90 calories. If you eat 2 slices, you will have eaten 180 calories.   How do I keep a food log? After each time that you eat, record the following in your food log as soon as possible:  What you ate. Be sure to include toppings, sauces, and other extras on the food.  How much you ate. This can be measured in cups, ounces, or number of items.  How many calories were in each food and drink.  The total number of calories in the food you ate. Keep your food log near you, such as in a pocket-sized notebook or on an app or website on your mobile phone. Some programs will calculate calories for you and show you how many calories you have left to meet your daily goal. What are some portion-control tips?  Know how many calories are in a serving. This will help you know how many servings you can have of a certain food.  Use a measuring cup to measure serving sizes. You could also try weighing out portions on a kitchen scale. With time, you will be able to estimate serving sizes for some foods.  Take time to put servings of different foods on your favorite plates or in your favorite bowls and cups so you know what a serving looks like.  Try not to eat straight from a food's packaging, such as from a bag or box. Eating straight from the package makes it hard to see how much you are eating and can lead to overeating. Put  the amount you would like to eat in a cup or on a plate to make sure you are eating the right portion.  Use smaller plates, glasses, and bowls for smaller portions and to prevent overeating.  Try not to multitask. For example, avoid watching TV or using your computer while eating. If it is time to eat, sit down at a table and enjoy your food. This will help you recognize when you are full. It  will also help you be more mindful of what and how much you are eating. What are tips for following this plan? Reading food labels  Check the calorie count compared with the serving size. The serving size may be smaller than what you are used to eating.  Check the source of the calories. Try to choose foods that are high in protein, fiber, and vitamins, and low in saturated fat, trans fat, and sodium. Shopping  Read nutrition labels while you shop. This will help you make healthy decisions about which foods to buy.  Pay attention to nutrition labels for low-fat or fat-free foods. These foods sometimes have the same number of calories or more calories than the full-fat versions. They also often have added sugar, starch, or salt to make up for flavor that was removed with the fat.  Make a grocery list of lower-calorie foods and stick to it. Cooking  Try to cook your favorite foods in a healthier way. For example, try baking instead of frying.  Use low-fat dairy products. Meal planning  Use more fruits and vegetables. One-half of your plate should be fruits and vegetables.  Include lean proteins, such as chicken, [place], and fish. Lifestyle Each week, aim to do one of the following:  150 minutes of moderate exercise, such as walking.  75 minutes of vigorous exercise, such as running. General information  Know how many calories are in the foods you eat most often. This will help you calculate calorie counts faster.  Find a way of tracking calories that works for you. Get creative. Try different apps or programs if writing down calories does not work for you. What foods should I eat?  Eat nutritious foods. It is better to have a nutritious, high-calorie food, such as an avocado, than a food with few nutrients, such as a bag of potato chips.  Use your calories on foods and drinks that will fill you up and will not leave you hungry soon after eating. ? Examples of foods that fill you  up are nuts and nut butters, vegetables, lean proteins, and high-fiber foods such as whole grains. High-fiber foods are foods with more than 5 g of fiber per serving.  Pay attention to calories in drinks. Low-calorie drinks include water and unsweetened drinks. The items listed above may not be a complete list of foods and beverages you can eat. Contact a dietitian for more information.   What foods should I limit? Limit foods or drinks that are not good sources of vitamins, minerals, or protein or that are high in unhealthy fats. These include:  Candy.  Other sweets.  Sodas, specialty coffee drinks, alcohol, and juice. The items listed above may not be a complete list of foods and beverages you should avoid. Contact a dietitian for more information. How do I count calories when eating out?  Pay attention to portions. Often, portions are much larger when eating out. Try these tips to keep portions smaller: ? Consider sharing a meal instead of getting your own. ? If  you get your own meal, eat only half of it. Before you start eating, ask for a container and put half of your meal into it. ? When available, consider ordering smaller portions from the menu instead of full portions.  Pay attention to your food and drink choices. Knowing the way food is cooked and what is included with the meal can help you eat fewer calories. ? If calories are listed on the menu, choose the lower-calorie options. ? Choose dishes that include vegetables, fruits, whole grains, low-fat dairy products, and lean proteins. ? Choose items that are boiled, broiled, grilled, or steamed. Avoid items that are buttered, battered, fried, or served with cream sauce. Items labeled as crispy are usually fried, unless stated otherwise. ? Choose water, low-fat milk, unsweetened iced tea, or other drinks without added sugar. If you want an alcoholic beverage, choose a lower-calorie option, such as a glass of wine or light  beer. ? Ask for dressings, sauces, and syrups on the side. These are usually high in calories, so you should limit the amount you eat. ? If you want a salad, choose a garden salad and ask for grilled meats. Avoid extra toppings such as bacon, cheese, or fried items. Ask for the dressing on the side, or ask for olive oil and vinegar or lemon to use as dressing.  Estimate how many servings of a food you are given. Knowing serving sizes will help you be aware of how much food you are eating at restaurants. Where to find more information  Centers for Disease Control and Prevention: http://www.wolf.info/  [place] Department of Agriculture: http://www.wilson-mendoza.org/ Summary  Calorie counting means keeping track of how many calories you eat and drink each day. If you eat fewer calories than your body needs, you should lose weight.  A healthy amount of weight to lose per week is usually 1-2 lb (0.5-0.9 kg). This usually means reducing your daily calorie intake by 500-750 calories.  The number of calories in a food can be found on a Nutrition Facts label. If a food does not have a Nutrition Facts label, try to look up the calories online or ask your dietitian for help.  Use smaller plates, glasses, and bowls for smaller portions and to prevent overeating.  Use your calories on foods and drinks that will fill you up and not leave you hungry shortly after a meal. This information is not intended to replace advice given to you by your health care provider. Make sure you discuss any questions you have with your health care provider. Document Revised: 11/12/2019 Document Reviewed: 11/12/2019 Elsevier Patient Education  2021 [place] and Cholesterol Restricted Eating Plan Getting too much fat and cholesterol in your diet may cause health problems. Choosing the right foods helps keep your fat and cholesterol at normal levels. This can keep you from getting certain diseases. Your doctor may recommend an eating plan that  includes:  Total fat: ______% or less of total calories a day.  Saturated fat: ______% or less of total calories a day.  Cholesterol: less than _________mg a day.  Fiber: ______g a day. What are tips for following this plan? Meal planning  At meals, divide your plate into four equal parts: ? Fill one-half of your plate with vegetables and green salads. ? Fill one-fourth of your plate with whole grains. ? Fill one-fourth of your plate with low-fat (lean) protein foods.  Eat fish that is high in omega-3 fats at least two times  a week. This includes mackerel, tuna, sardines, and salmon.  Eat foods that are high in fiber, such as whole grains, beans, apples, broccoli, carrots, peas, and barley. General tips  Work with your doctor to lose weight if you need to.  Avoid: ? Foods with added sugar. ? Fried foods. ? Foods with partially hydrogenated oils.  Limit alcohol intake to no more than 1 drink a day for nonpregnant women and 2 drinks a day for men. One drink equals 12 oz of beer, 5 oz of wine, or 1 oz of hard liquor.   Reading food labels  Check food labels for: ? Trans fats. ? Partially hydrogenated oils. ? Saturated fat (g) in each serving. ? Cholesterol (mg) in each serving. ? Fiber (g) in each serving.  Choose foods with healthy fats, such as: ? Monounsaturated fats. ? Polyunsaturated fats. ? Omega-3 fats.  Choose grain products that have whole grains. Look for the word "whole" as the first word in the ingredient list. Cooking  Cook foods using low-fat methods. These include baking, boiling, grilling, and broiling.  Eat more home-cooked foods. Eat at restaurants and buffets less often.  Avoid cooking using saturated fats, such as butter, cream, palm oil, palm kernel oil, and coconut oil. Recommended foods Fruits  All fresh, canned (in natural juice), or frozen fruits. Vegetables  Fresh or frozen vegetables (raw, steamed, roasted, or grilled). Green  salads. Grains  Whole grains, such as whole wheat or whole grain breads, crackers, cereals, and pasta. Unsweetened oatmeal, bulgur, barley, quinoa, or brown rice. Corn or whole wheat flour tortillas. Meats and other protein foods  Ground beef (85% or leaner), grass-fed beef, or beef trimmed of fat. Skinless chicken or [place]. Ground chicken or [place]. Pork trimmed of fat. All fish and seafood. Egg whites. Dried beans, peas, or lentils. Unsalted nuts or seeds. Unsalted canned beans. Nut butters without added sugar or oil. Dairy  Low-fat or nonfat dairy products, such as skim or 1% milk, 2% or reduced-fat cheeses, low-fat and fat-free ricotta or cottage cheese, or plain low-fat and nonfat yogurt. Fats and oils  Tub margarine without trans fats. Light or reduced-fat mayonnaise and salad dressings. Avocado. Olive, canola, sesame, or safflower oils. The items listed above may not be a complete list of foods and beverages you can eat. Contact a dietitian for more information.   Foods to avoid Fruits  Canned fruit in heavy syrup. Fruit in cream or butter sauce. Fried fruit. Vegetables  Vegetables cooked in cheese, cream, or butter sauce. Fried vegetables. Grains  White bread. White pasta. White rice. Cornbread. Bagels, pastries, and croissants. Crackers and snack foods that contain trans fat and hydrogenated oils. Meats and other protein foods  Fatty cuts of meat. Ribs, chicken wings, bacon, sausage, bologna, salami, chitterlings, fatback, hot dogs, bratwurst, and packaged lunch meats. Liver and organ meats. Whole eggs and egg yolks. Chicken and [place] with skin. Fried meat. Dairy  Whole or 2% milk, cream, half-and-half, and cream cheese. Whole milk cheeses. Whole-fat or sweetened yogurt. Full-fat cheeses. Nondairy creamers and whipped toppings. Processed cheese, cheese spreads, and cheese curds. Beverages  Alcohol. Sugar-sweetened drinks such as sodas, lemonade, and fruit drinks. Fats and  oils  Butter, stick margarine, lard, shortening, ghee, or bacon fat. Coconut, palm kernel, and palm oils. Sweets and desserts  Corn syrup, sugars, honey, and molasses. Candy. Jam and jelly. Syrup. Sweetened cereals. Cookies, pies, cakes, donuts, muffins, and ice cream. The items listed above may not be a complete list  of foods and beverages you should avoid. Contact a dietitian for more information. Summary  Choosing the right foods helps keep your fat and cholesterol at normal levels. This can keep you from getting certain diseases.  At meals, fill one-half of your plate with vegetables and green salads.  Eat high-fiber foods, like whole grains, beans, apples, carrots, peas, and barley.  Limit added sugar, saturated fats, alcohol, and fried foods. This information is not intended to replace advice given to you by your health care provider. Make sure you discuss any questions you have with your health care provider. Document Revised: 02/03/2020 Document Reviewed: 02/03/2020 Elsevier Patient Education  2021 [employer].

## 2020-12-16 NOTE — Progress Notes (Signed)
Established Patient Office Visit  Subjective:  Patient ID: Anne Montgomery, female    DOB: [birth year]  Age: 50 y.o. MRN: [id number]  CC:  Chief Complaint  Patient presents with  . Consult    HPI Anne Montgomery presents for establishment of care, patient needs a colonoscopy referral.  Patient reports she is feeling well.  History of sinusitis.   She lost her first husband three years ago with a massive heart attack in the hospital parking lot after leaving the urgent care.  She is now remarried and very happy.   Patient  denies any fever, body aches,chills, rash, chest pain, shortness of breath, nausea, vomiting, or diarrhea.   Denies dizziness, lightheadedness, pre syncopal or syncopal episodes.   Past Medical History:  Diagnosis Date  . Actinic keratosis   . Dysplastic nevus 08/05/2014   Right pubic. Mild atypia, limited margins free.  Marland Kitchen Migraines     Past Surgical History:  Procedure Laterality Date  . BACK SURGERY  2009  . BREAST BIOPSY  2009   back sur  . NASAL SINUS SURGERY    . TUBAL LIGATION  2002    Family History  Problem Relation Age of Onset  . Lymphoma Father 40  . Prostate cancer Father   . Cancer Father        lymphoma  . Kidney cancer Mother   . Bladder Cancer Neg Hx     Social History   Socioeconomic History  . Marital status: Widowed    Spouse name: Not on file  . Number of children: Not on file  . Years of education: Not on file  . Highest education level: Not on file  Occupational History  . Not on file  Tobacco Use  . Smoking status: Never Smoker  . Smokeless tobacco: Never Used  Substance and Sexual Activity  . Alcohol use: No  . Drug use: No  . Sexual activity: Not on file  Other Topics Concern  . Not on file  Social History Narrative  . Not on file   Social Determinants of Health   Financial Resource Strain: Not on file  Food Insecurity: Not on file  Transportation Needs: Not on file  Physical  Activity: Not on file  Stress: Not on file  Social Connections: Not on file  Intimate Partner Violence: Not on file    Outpatient Medications Prior to Visit  Medication Sig Dispense Refill  . ALAYCEN 1/35 tablet TAKE 1 TABLET BY MOUTH EVERY DAY (ONLY TAKING ACTIVE TABS)  1  . co-enzyme Q-10 30 MG capsule Take 30 mg by mouth 3 (three) times daily.    . Ginger, Zingiber officinalis, (GINGER ROOT PO) Take by mouth.    . Misc Natural Products (JOINT SUPPORT COMPLEX PO) Take by mouth.    . SUMAtriptan (IMITREX) 50 MG tablet TAKE 1 TABLET BY MOUTH AT ONSET OF HEADACHE. MAY REPEAT IF NEEDED IN 2 HOURS 10 tablet 1  . TURMERIC PO Take by mouth.    Marland Kitchen VITAMIN D PO Take 1,500 Units by mouth.    Marland Kitchen amoxicillin-clavulanate (AUGMENTIN) 875-125 MG tablet Take 1 tablet by mouth 2 (two) times daily. 20 tablet 0  . SUMAtriptan (IMITREX) 25 MG tablet Take by mouth.    . topiramate (TOPAMAX) 25 MG tablet Take 1 tablet (25 mg total) by mouth 2 (two) times daily. (Patient not taking: Reported on 03/12/2017) 30 tablet 0   No facility-administered medications prior to visit.    Allergies  Allergen  Reactions  . Biaxin [Clarithromycin]   . Codeine     ROS Review of Systems  Constitutional: Negative.   HENT: Negative.   Eyes: Negative.   Respiratory: Negative.   Cardiovascular: Negative.   Gastrointestinal: Negative.   Genitourinary: Negative.   Musculoskeletal: Negative.   Neurological: Negative.   Hematological: Negative.   Psychiatric/Behavioral: Negative.       Objective:    Physical Exam Vitals reviewed.  Constitutional:      General: She is not in acute distress.    Appearance: She is well-developed. She is not diaphoretic.     Interventions: She is not intubated. HENT:     Head: Normocephalic and atraumatic.     Right Ear: External ear normal.     Left Ear: External ear normal.     Nose: Nose normal.     Mouth/Throat:     Pharynx: No oropharyngeal exudate.  Eyes:     General:  Lids are normal. No scleral icterus.       Right eye: No discharge.        Left eye: No discharge.     Conjunctiva/sclera: Conjunctivae normal.     Right eye: Right conjunctiva is not injected. No exudate or hemorrhage.    Left eye: Left conjunctiva is not injected. No exudate or hemorrhage.    Pupils: Pupils are equal, round, and reactive to light.  Neck:     Thyroid: No thyroid mass or thyromegaly.     Vascular: Normal carotid pulses. No carotid bruit, hepatojugular reflux or JVD.     Trachea: Trachea and phonation normal. No tracheal tenderness or tracheal deviation.     Meningeal: Brudzinski's sign and Kernig's sign absent.  Cardiovascular:     Rate and Rhythm: Normal rate and regular rhythm.     Pulses: Normal pulses.          Radial pulses are 2+ on the right side and 2+ on the left side.       Dorsalis pedis pulses are 2+ on the right side and 2+ on the left side.       Posterior tibial pulses are 2+ on the right side and 2+ on the left side.     Heart sounds: Normal heart sounds, S1 normal and S2 normal. Heart sounds not distant. No murmur heard. No friction rub. No gallop.   Pulmonary:     Effort: Pulmonary effort is normal. No tachypnea, bradypnea, accessory muscle usage or respiratory distress. She is not intubated.     Breath sounds: Normal breath sounds. No stridor. No wheezing or rales.  Chest:     Chest wall: No tenderness.  Breasts:     Right: No supraclavicular adenopathy.     Left: No supraclavicular adenopathy.    Abdominal:     General: Bowel sounds are normal. There is no distension or abdominal bruit.     Palpations: Abdomen is soft. There is no shifting dullness, fluid wave, hepatomegaly, splenomegaly, mass or pulsatile mass.     Tenderness: There is no abdominal tenderness. There is no guarding or rebound.     Hernia: No hernia is present.  Musculoskeletal:        General: No tenderness or deformity. Normal range of motion.     Cervical back: Full passive  range of motion without pain, normal range of motion and neck supple. No edema, erythema or rigidity. No spinous process tenderness or muscular tenderness. Normal range of motion.  Lymphadenopathy:     Head:  Right side of head: No submental, submandibular, tonsillar, preauricular, posterior auricular or occipital adenopathy.     Left side of head: No submental, submandibular, tonsillar, preauricular, posterior auricular or occipital adenopathy.     Cervical: No cervical adenopathy.     Right cervical: No superficial, deep or posterior cervical adenopathy.    Left cervical: No superficial, deep or posterior cervical adenopathy.     Upper Body:     Right upper body: No supraclavicular or pectoral adenopathy.     Left upper body: No supraclavicular or pectoral adenopathy.  Skin:    General: Skin is warm and dry.     Coloration: Skin is not pale.     Findings: No abrasion, bruising, burn, ecchymosis, erythema, lesion, petechiae or rash.     Nails: There is no clubbing.  Neurological:     Mental Status: She is alert and oriented to person, place, and time.     GCS: GCS eye subscore is 4. GCS verbal subscore is 5. GCS motor subscore is 6.     Cranial Nerves: No cranial nerve deficit.     Sensory: No sensory deficit.     Motor: No tremor, atrophy, abnormal muscle tone or seizure activity.     Coordination: Coordination normal.     Gait: Gait normal.     Deep Tendon Reflexes: Reflexes are normal and symmetric.     Reflex Scores:      Tricep reflexes are 2+ on the right side and 2+ on the left side.      Bicep reflexes are 2+ on the right side and 2+ on the left side.      Brachioradialis reflexes are 2+ on the right side and 2+ on the left side.      Patellar reflexes are 2+ on the right side and 2+ on the left side.      Achilles reflexes are 2+ on the right side and 2+ on the left side. Psychiatric:        Speech: Speech normal.        Behavior: Behavior normal.        Thought  Content: Thought content normal.        Judgment: Judgment normal.     BP 139/81   Pulse 76   Resp 16   Wt 203 lb (92.1 kg)   SpO2 99%   BMI 31.79 kg/m  Wt Readings from Last 3 Encounters:  12/19/20 203 lb (92.1 kg)  11/19/18 173 lb 12.8 oz (78.8 kg)  03/12/17 172 lb 6.4 oz (78.2 kg)     Health Maintenance Due  Topic Date Due  . Hepatitis C Screening  Never done  . COVID-19 Vaccine (1) Never done  . HIV Screening  Never done  . TETANUS/TDAP  Never done  . PAP SMEAR-Modifier  Never done  . COLONOSCOPY (Pts 45-72yrs Insurance coverage will need to be confirmed)  Never done  . MAMMOGRAM  Never done  . INFLUENZA VACCINE  Never done    There are no preventive care reminders to display for this patient.  Lab Results  Component Value Date   TSH 3.760 12/23/2020   Lab Results  Component Value Date   WBC 8.5 12/23/2020   HGB 14.8 12/23/2020   HCT 45.5 12/23/2020   MCV 92 12/23/2020   PLT 377 12/23/2020   Lab Results  Component Value Date   NA 136 12/23/2020   K 4.8 12/23/2020   CO2 20 12/23/2020   GLUCOSE 92 12/23/2020  BUN 15 12/23/2020   CREATININE 0.81 12/23/2020   BILITOT 0.3 12/23/2020   ALKPHOS 58 12/23/2020   AST 19 12/23/2020   ALT 12 12/23/2020   PROT 7.3 12/23/2020   ALBUMIN 4.7 12/23/2020   CALCIUM 9.9 12/23/2020   Lab Results  Component Value Date   CHOL 197 12/23/2020   Lab Results  Component Value Date   HDL 53 12/23/2020   Lab Results  Component Value Date   LDLCALC 122 (H) 12/23/2020   Lab Results  Component Value Date   TRIG 126 12/23/2020   Lab Results  Component Value Date   CHOLHDL 3.7 12/23/2020   No results found for: HGBA1C    Assessment & Plan:   Problem List Items Addressed This Visit      Other   Screening for colon cancer   Relevant Orders   Ambulatory referral to Gastroenterology   History of migraine   Relevant Orders   CBC with Differential/Platelet (Completed)   Comprehensive metabolic panel  (Completed)   TSH (Completed)   Lipid panel (Completed)   Vitamin D deficiency   Relevant Orders   VITAMIN D 25 Hydroxy (Vit-D Deficiency, Fractures) (Completed)   Vitamin B12 deficiency   Relevant Orders   CBC with Differential/Platelet (Completed)   Comprehensive metabolic panel (Completed)   TSH (Completed)   Lipid panel (Completed)   History of hematuria- has seen urologist for work up.    Body mass index (BMI) of 37.0-37.9 in adult   Routine health maintenance - Primary      Routine health maintenance  Screening for blood or protein in urine - Plan: CANCELED: POCT urinalysis dipstick  Screening mammogram for breast cancer  History of migraine - Plan: CBC with Differential/Platelet, Comprehensive metabolic panel, TSH, Lipid panel  Screening for colon cancer - Plan: Ambulatory referral to Gastroenterology  Vitamin D deficiency- taking supplement.  - Plan: VITAMIN D 25 Hydroxy (Vit-D Deficiency, Fractures)  Vitamin B12 deficiency- taking supplement.  - Plan: CBC with Differential/Platelet, Comprehensive metabolic panel, TSH, Lipid panel, CANCELED: POCT urinalysis dipstick  History of hematuria- has seen urologist for work up.   Body mass index (BMI) of 37.0-37.9 in adult   Routine health maintenance  Screening for blood or protein in urine - Plan: CANCELED: POCT urinalysis dipstick  Screening mammogram for breast cancer  History of migraine - Plan: CBC with Differential/Platelet, Comprehensive metabolic panel, TSH, Lipid panel  Screening for colon cancer - Plan: Ambulatory referral to Gastroenterology  Vitamin D deficiency- taking supplement.  - Plan: VITAMIN D 25 Hydroxy (Vit-D Deficiency, Fractures)  Vitamin B12 deficiency- taking supplement.  - Plan: CBC with Differential/Platelet, Comprehensive metabolic panel, TSH, Lipid panel, CANCELED: POCT urinalysis dipstick  History of hematuria- has seen urologist for work up.   Body mass index (BMI) of 37.0-37.9 in  adult    1 No orders of the defined types were placed in this encounter.   Follow-up: Return in about 1 year (around 12/19/2021), or if symptoms worsen or fail to improve, for at any time for any worsening symptoms, Go to Emergency room/ urgent care if worse.    Marcille Buffy, FNP

## 2020-12-19 ENCOUNTER — Encounter: Payer: Self-pay | Admitting: Adult Health

## 2020-12-19 ENCOUNTER — Other Ambulatory Visit: Payer: Self-pay

## 2020-12-19 ENCOUNTER — Ambulatory Visit (INDEPENDENT_AMBULATORY_CARE_PROVIDER_SITE_OTHER): Payer: Self-pay | Admitting: Adult Health

## 2020-12-19 VITALS — BP 139/81 | HR 76 | Resp 16 | Wt 203.0 lb

## 2020-12-19 DIAGNOSIS — Z6837 Body mass index (BMI) 37.0-37.9, adult: Secondary | ICD-10-CM | POA: Insufficient documentation

## 2020-12-19 DIAGNOSIS — Z1211 Encounter for screening for malignant neoplasm of colon: Secondary | ICD-10-CM

## 2020-12-19 DIAGNOSIS — Z0000 Encounter for general adult medical examination without abnormal findings: Secondary | ICD-10-CM

## 2020-12-19 DIAGNOSIS — E559 Vitamin D deficiency, unspecified: Secondary | ICD-10-CM

## 2020-12-19 DIAGNOSIS — E538 Deficiency of other specified B group vitamins: Secondary | ICD-10-CM | POA: Insufficient documentation

## 2020-12-19 DIAGNOSIS — Z8669 Personal history of other diseases of the nervous system and sense organs: Secondary | ICD-10-CM

## 2020-12-19 DIAGNOSIS — Z1231 Encounter for screening mammogram for malignant neoplasm of breast: Secondary | ICD-10-CM

## 2020-12-19 DIAGNOSIS — Z87448 Personal history of other diseases of urinary system: Secondary | ICD-10-CM

## 2020-12-19 DIAGNOSIS — Z1389 Encounter for screening for other disorder: Secondary | ICD-10-CM

## 2020-12-24 DIAGNOSIS — Z0000 Encounter for general adult medical examination without abnormal findings: Secondary | ICD-10-CM | POA: Insufficient documentation

## 2020-12-24 LAB — LIPID PANEL
Chol/HDL Ratio: 3.7 ratio (ref 0.0–4.4)
Cholesterol, Total: 197 mg/dL (ref 100–199)
HDL: 53 mg/dL (ref >39)
LDL Chol Calc (NIH): 122 mg/dL — ABNORMAL HIGH (ref 0–99)
Triglycerides: 126 mg/dL (ref 0–149)
VLDL Cholesterol Cal: 22 mg/dL (ref 5–40)

## 2020-12-24 LAB — CBC WITH DIFFERENTIAL/PLATELET
Basophils Absolute: 0 10*3/uL (ref 0.0–0.2)
Basos: 1 %
EOS (ABSOLUTE): 0.1 10*3/uL (ref 0.0–0.4)
Eos: 1 %
Hematocrit: 45.5 % (ref 34.0–46.6)
Hemoglobin: 14.8 g/dL (ref 11.1–15.9)
Immature Grans (Abs): 0 10*3/uL (ref 0.0–0.1)
Immature Granulocytes: 0 %
Lymphocytes Absolute: 2.4 10*3/uL (ref 0.7–3.1)
Lymphs: 28 %
MCH: 29.9 pg (ref 26.6–33.0)
MCHC: 32.5 g/dL (ref 31.5–35.7)
MCV: 92 fL (ref 79–97)
Monocytes Absolute: 0.6 10*3/uL (ref 0.1–0.9)
Monocytes: 7 %
Neutrophils Absolute: 5.4 10*3/uL (ref 1.4–7.0)
Neutrophils: 63 %
Platelets: 377 10*3/uL (ref 150–450)
RBC: 4.95 x10E6/uL (ref 3.77–5.28)
RDW: 11.8 % (ref 11.7–15.4)
WBC: 8.5 10*3/uL (ref 3.4–10.8)

## 2020-12-24 LAB — COMPREHENSIVE METABOLIC PANEL
ALT: 12 IU/L (ref 0–32)
AST: 19 IU/L (ref 0–40)
Albumin/Globulin Ratio: 1.8 (ref 1.2–2.2)
Albumin: 4.7 g/dL (ref 3.8–4.9)
Alkaline Phosphatase: 58 IU/L (ref 44–121)
BUN/Creatinine Ratio: 19 (ref 9–23)
BUN: 15 mg/dL (ref 6–24)
Bilirubin Total: 0.3 mg/dL (ref 0.0–1.2)
CO2: 20 mmol/L (ref 20–29)
Calcium: 9.9 mg/dL (ref 8.7–10.2)
Chloride: 99 mmol/L (ref 96–106)
Creatinine, Ser: 0.81 mg/dL (ref 0.57–1.00)
Globulin, Total: 2.6 g/dL (ref 1.5–4.5)
Glucose: 92 mg/dL (ref 65–99)
Potassium: 4.8 mmol/L (ref 3.5–5.2)
Sodium: 136 mmol/L (ref 134–144)
Total Protein: 7.3 g/dL (ref 6.0–8.5)
eGFR: 88 mL/min/{1.73_m2} (ref >59)

## 2020-12-24 LAB — TSH: TSH: 3.76 u[IU]/mL (ref 0.450–4.500)

## 2020-12-24 LAB — VITAMIN D 25 HYDROXY (VIT D DEFICIENCY, FRACTURES): Vit D, 25-Hydroxy: 54.4 ng/mL (ref 30.0–100.0)

## 2020-12-24 NOTE — Progress Notes (Signed)
CBC , CMP,within normal limits.  TSH for thyroid within normal limits.   LDL elevated.  Discuss lifestyle modification with patient e.g. increase exercise, fiber, fruits, vegetables, lean meat, and omega 3/fish intake and decrease saturated fat.  If patient following strict diet and exercise program already please schedule follow up appointment with primary care physician Vitamin D within normal limits.

## 2020-12-26 ENCOUNTER — Other Ambulatory Visit: Payer: Self-pay

## 2020-12-26 ENCOUNTER — Telehealth (INDEPENDENT_AMBULATORY_CARE_PROVIDER_SITE_OTHER): Payer: Self-pay | Admitting: Gastroenterology

## 2020-12-26 DIAGNOSIS — Z1211 Encounter for screening for malignant neoplasm of colon: Secondary | ICD-10-CM

## 2020-12-26 MED ORDER — NA SULFATE-K SULFATE-MG SULF 17.5-3.13-1.6 GM/177ML PO SOLN: 1.0000 | Freq: Once | ORAL | 0 refills | Status: AC

## 2020-12-26 NOTE — Progress Notes (Signed)
Gastroenterology Pre-Procedure Review  Request Date: Monday 01/09/21 Requesting Physician: Dr. Allen Norris  PATIENT REVIEW QUESTIONS: The patient responded to the following health history questions as indicated:    1. Are you having any GI issues? occasional hemorrhoids 2. Do you have a personal history of Polyps? no 3. Do you have a family history of Colon Cancer or Polyps? yes (uncle colon cancer) 4. Diabetes Mellitus? no 5. Joint replacements in the past 12 months?no 6. Major health problems in the past 3 months?no 7. Any artificial heart valves, MVP, or defibrillator?no    MEDICATIONS & ALLERGIES:    Patient reports the following regarding taking any anticoagulation/antiplatelet therapy:   Plavix, Coumadin, Eliquis, Xarelto, Lovenox, Pradaxa, Brilinta, or Effient? no Aspirin? no  Patient confirms/reports the following medications:  Current Outpatient Medications  Medication Sig Dispense Refill  . ALAYCEN 1/35 tablet TAKE 1 TABLET BY MOUTH EVERY DAY (ONLY TAKING ACTIVE TABS)  1  . co-enzyme Q-10 30 MG capsule Take 30 mg by mouth 3 (three) times daily.    . Ginger, Zingiber officinalis, (GINGER ROOT PO) Take by mouth.    . Misc Natural Products (JOINT SUPPORT COMPLEX PO) Take by mouth.    . SUMAtriptan (IMITREX) 50 MG tablet TAKE 1 TABLET BY MOUTH AT ONSET OF HEADACHE. MAY REPEAT IF NEEDED IN 2 HOURS 10 tablet 1  . TURMERIC PO Take by mouth.    Marland Kitchen VITAMIN D PO Take 1,500 Units by mouth.     No current facility-administered medications for this visit.    Patient confirms/reports the following allergies:  Allergies  Allergen Reactions  . Biaxin [Clarithromycin]   . Codeine     No orders of the defined types were placed in this encounter.   AUTHORIZATION INFORMATION Primary Insurance: 1D#: Group #:  Secondary Insurance: 1D#: Group #:  SCHEDULE INFORMATION: Date: Monday 01/09/21 Time: Location:[facility]

## 2021-01-02 ENCOUNTER — Encounter: Payer: Self-pay | Admitting: Gastroenterology

## 2021-01-02 ENCOUNTER — Other Ambulatory Visit: Payer: Self-pay

## 2021-01-05 ENCOUNTER — Other Ambulatory Visit: Payer: Self-pay

## 2021-01-05 ENCOUNTER — Other Ambulatory Visit
Admission: RE | Admit: 2021-01-05 | Discharge: 2021-01-05 | Disposition: A | Discharge status: Home / Self Care | Payer: Self-pay | Source: Ambulatory Visit | Attending: Gastroenterology | Admitting: Gastroenterology

## 2021-01-05 DIAGNOSIS — Z20822 Contact with and (suspected) exposure to covid-19: Secondary | ICD-10-CM | POA: Insufficient documentation

## 2021-01-05 DIAGNOSIS — Z01812 Encounter for preprocedural laboratory examination: Secondary | ICD-10-CM | POA: Insufficient documentation

## 2021-01-05 LAB — SARS CORONAVIRUS 2 (TAT 6-24 HRS): SARS Coronavirus 2: NEGATIVE

## 2021-01-09 ENCOUNTER — Ambulatory Visit: Payer: Self-pay | Admitting: Anesthesiology

## 2021-01-09 ENCOUNTER — Other Ambulatory Visit: Payer: Self-pay

## 2021-01-09 ENCOUNTER — Encounter: Payer: Self-pay | Admitting: Gastroenterology

## 2021-01-09 ENCOUNTER — Ambulatory Visit
Admission: RE | Admit: 2021-01-09 | Discharge: 2021-01-09 | Disposition: A | Discharge status: Home / Self Care | Payer: Self-pay | Attending: Gastroenterology | Admitting: Gastroenterology

## 2021-01-09 ENCOUNTER — Encounter
Admission: RE | Disposition: A | Discharge status: Home / Self Care | Payer: Self-pay | Source: Home / Self Care | Attending: Gastroenterology

## 2021-01-09 DIAGNOSIS — Z8051 Family history of malignant neoplasm of kidney: Secondary | ICD-10-CM | POA: Insufficient documentation

## 2021-01-09 DIAGNOSIS — Z8042 Family history of malignant neoplasm of prostate: Secondary | ICD-10-CM | POA: Insufficient documentation

## 2021-01-09 DIAGNOSIS — K640 First degree hemorrhoids: Secondary | ICD-10-CM | POA: Insufficient documentation

## 2021-01-09 DIAGNOSIS — Z79899 Other long term (current) drug therapy: Secondary | ICD-10-CM | POA: Insufficient documentation

## 2021-01-09 DIAGNOSIS — Z807 Family history of other malignant neoplasms of lymphoid, hematopoietic and related tissues: Secondary | ICD-10-CM | POA: Insufficient documentation

## 2021-01-09 DIAGNOSIS — K219 Gastro-esophageal reflux disease without esophagitis: Secondary | ICD-10-CM | POA: Insufficient documentation

## 2021-01-09 DIAGNOSIS — Z1211 Encounter for screening for malignant neoplasm of colon: Secondary | ICD-10-CM | POA: Insufficient documentation

## 2021-01-09 HISTORY — DX: Gastro-esophageal reflux disease without esophagitis: K21.9

## 2021-01-09 HISTORY — PX: COLONOSCOPY WITH PROPOFOL: SHX5780

## 2021-01-09 SURGERY — COLONOSCOPY WITH PROPOFOL
Anesthesia: General | Site: Rectum

## 2021-01-09 MED ORDER — STERILE WATER FOR IRRIGATION IR SOLN: Status: DC | PRN

## 2021-01-09 MED ORDER — SODIUM CHLORIDE 0.9 % IV SOLN: INTRAVENOUS | Status: DC

## 2021-01-09 MED ORDER — PROPOFOL 10 MG/ML IV BOLUS
INTRAVENOUS | Status: DC | PRN
  Administered 2021-01-09: 80 mg via INTRAVENOUS
  Administered 2021-01-09: 20 mg via INTRAVENOUS
  Administered 2021-01-09: 10 mg via INTRAVENOUS
  Administered 2021-01-09: 20 mg via INTRAVENOUS
  Administered 2021-01-09 (×2): 10 mg via INTRAVENOUS

## 2021-01-09 MED ORDER — LIDOCAINE HCL (CARDIAC) PF 100 MG/5ML IV SOSY
PREFILLED_SYRINGE | INTRAVENOUS | Status: DC | PRN
  Administered 2021-01-09: 50 mg via INTRAVENOUS

## 2021-01-09 MED ORDER — LACTATED RINGERS IV SOLN: INTRAVENOUS | Status: DC | PRN

## 2021-01-09 SURGICAL SUPPLY — 6 items
GOWN CVR UNV OPN BCK APRN NK (MISCELLANEOUS) ×2 IMPLANT
GOWN ISOL THUMB LOOP REG UNIV (MISCELLANEOUS) ×4
KIT PRC NS LF DISP ENDO (KITS) ×1 IMPLANT
KIT PROCEDURE OLYMPUS (KITS) ×2
MANIFOLD NEPTUNE II (INSTRUMENTS) ×2 IMPLANT
WATER STERILE IRR 250ML POUR (IV SOLUTION) ×2 IMPLANT

## 2021-01-09 NOTE — Anesthesia Procedure Notes (Signed)
Procedure Name: MAC Date/Time: 01/09/2021 8:50 AM Performed by: Vanetta Shawl, CRNA Pre-anesthesia Checklist: Patient identified, Emergency Drugs available, Suction available, Timeout performed and Patient being monitored Patient Re-evaluated:Patient Re-evaluated prior to induction Oxygen Delivery Method: Nasal cannula Placement Confirmation: positive ETCO2

## 2021-01-09 NOTE — H&P (Signed)
Lucilla Lame, MD [facility] [address] [place], [place] [address] Phone: [phone] Fax : [phone]  Primary Care Physician:  Doreen Beam, FNP Primary Gastroenterologist:  Dr. Allen Norris  Pre-Procedure History & Physical: HPI:  Anne Montgomery is a 50 y.o. female is here for a screening colonoscopy.   Past Medical History:  Diagnosis Date  . Actinic keratosis   . Dysplastic nevus 08/05/2014   Right pubic. Mild atypia, limited margins free.  Marland Kitchen GERD (gastroesophageal reflux disease)   . Migraines    2-3x/yr    Past Surgical History:  Procedure Laterality Date  . BACK SURGERY  2009  . BREAST BIOPSY  2009   back sur  . NASAL SINUS SURGERY    . TUBAL LIGATION  2002    Prior to Admission medications   Medication Sig Start Date End Date Taking? Authorizing Provider  ALAYCEN 1/35 tablet TAKE 1 TABLET BY MOUTH EVERY DAY (ONLY TAKING ACTIVE TABS) 08/22/15  Yes [provider]  Ascorbic Acid (VITAMIN C PO) Take by mouth.   Yes [provider]  co-enzyme Q-10 30 MG capsule Take 30 mg by mouth 3 (three) times daily.   Yes [provider]  Ginger, Zingiber officinalis, (GINGER ROOT PO) Take by mouth.   Yes [provider]  GINSENG PO Take by mouth.   Yes [provider]  MAGNESIUM PO Take by mouth.   Yes [provider]  Misc Natural Products (JOINT SUPPORT COMPLEX PO) Take by mouth.   Yes [provider]  Multiple Vitamins-Minerals (ZINC PO) Take by mouth.   Yes [provider]  SUMAtriptan (IMITREX) 50 MG tablet TAKE 1 TABLET BY MOUTH AT ONSET OF HEADACHE. MAY REPEAT IF NEEDED IN 2 HOURS 07/11/17  Yes Mar Daring, PA-C  TURMERIC PO Take by mouth.   Yes [provider]  VITAMIN D PO Take 1,500 Units by mouth.   Yes [provider]    Allergies as of 12/26/2020 - Review Complete 12/26/2020  Allergen Reaction Noted  . Biaxin [clarithromycin]  03/22/2012  .  Codeine  03/22/2012    Family History  Problem Relation Age of Onset  . Lymphoma Father 17  . Prostate cancer Father   . Cancer Father        lymphoma  . Kidney cancer Mother   . Bladder Cancer Neg Hx     Social History   Socioeconomic History  . Marital status: Widowed    Spouse name: Not on file  . Number of children: Not on file  . Years of education: Not on file  . Highest education level: Not on file  Occupational History  . Not on file  Tobacco Use  . Smoking status: Never Smoker  . Smokeless tobacco: Never Used  Vaping Use  . Vaping Use: Never used  Substance and Sexual Activity  . Alcohol use: No  . Drug use: No  . Sexual activity: Not on file  Other Topics Concern  . Not on file  Social History Narrative  . Not on file   Social Determinants of Health   Financial Resource Strain: Not on file  Food Insecurity: Not on file  Transportation Needs: Not on file  Physical Activity: Not on file  Stress: Not on file  Social Connections: Not on file  Intimate Partner Violence: Not on file    Review of Systems: See HPI, otherwise negative ROS  Physical Exam: BP 139/80   Pulse 71   Temp (!)  97.5 F (36.4 C) (Temporal)   Ht 5\' 7"  (1.702 m)   Wt 88.5 kg   SpO2 98%   BMI 30.54 kg/m  General:   Alert,  pleasant and cooperative in NAD Head:  Normocephalic and atraumatic. Neck:  Supple; no masses or thyromegaly. Lungs:  Clear throughout to auscultation.    Heart:  Regular rate and rhythm. Abdomen:  Soft, nontender and nondistended. Normal bowel sounds, without guarding, and without rebound.   Neurologic:  Alert and  oriented x4;  grossly normal neurologically.  Impression/Plan: Anne Montgomery is now here to undergo a screening colonoscopy.  Risks, benefits, and alternatives regarding colonoscopy have been reviewed with the patient.  Questions have been answered.  All parties agreeable.

## 2021-01-09 NOTE — Op Note (Signed)
[facility] Gastroenterology Patient Name: Anne Montgomery Procedure Date: 01/09/2021 8:45 AM MRN: [id number] Account #: [id number] Date of Birth: [birth year] Admit Type: Outpatient Age: 50 Room: [facility] OR ROOM 01 Gender: Female Note Status: Finalized Procedure:             Colonoscopy Indications:           Screening for colorectal malignant neoplasm Providers:             Lucilla Lame MD, MD Referring MD:          Kelby Aline. Flinchum (Referring MD) Medicines:             Propofol per Anesthesia Complications:         No immediate complications. Procedure:             Pre-Anesthesia Assessment:                        - Prior to the procedure, a History and Physical was                         performed, and patient medications and allergies were                         reviewed. The patient's tolerance of previous                         anesthesia was also reviewed. The risks and benefits                         of the procedure and the sedation options and risks                         were discussed with the patient. All questions were                         answered, and informed consent was obtained. Prior                         Anticoagulants: The patient has taken no previous                         anticoagulant or antiplatelet agents. ASA Grade                         Assessment: II - A patient with mild systemic disease.                         After reviewing the risks and benefits, the patient                         was deemed in satisfactory condition to undergo the                         procedure.                        After obtaining informed consent, the colonoscope was  passed under direct vision. Throughout the procedure,                         the patient's blood pressure, pulse, and oxygen                         saturations were monitored continuously. The was                         introduced through the anus  and advanced to the the                         cecum, identified by appendiceal orifice and ileocecal                         valve. The colonoscopy was performed without                         difficulty. The patient tolerated the procedure well.                         The quality of the bowel preparation was excellent. Findings:      The perianal and digital rectal examinations were normal.      Non-bleeding internal hemorrhoids were found during retroflexion. The       hemorrhoids were Grade I (internal hemorrhoids that do not prolapse). Impression:            - Non-bleeding internal hemorrhoids.                        - No specimens collected. Recommendation:        - Discharge patient to home.                        - Resume previous diet.                        - Continue present medications.                        - Repeat colonoscopy in 10 years for screening                         purposes. Procedure Code(s):     --- Professional ---                        [id number], Colonoscopy, flexible; diagnostic, including                         collection of specimen(s) by brushing or washing, when                         performed (separate procedure) Diagnosis Code(s):     --- Professional ---                        Z12.11, Encounter for screening for malignant neoplasm                         of colon CPT copyright 2019 American  Medical Association. All rights reserved. The codes documented in this report are preliminary and upon coder review may  be revised to meet current compliance requirements. Lucilla Lame MD, MD 01/09/2021 9:04:39 AM This report has been signed electronically. Number of Addenda: 0 Note Initiated On: 01/09/2021 8:45 AM Scope Withdrawal Time: 0 hours 6 minutes 47 seconds  Total Procedure Duration: 0 hours 10 minutes 16 seconds  Estimated Blood Loss:  Estimated blood loss: none.      [facility]

## 2021-01-09 NOTE — Discharge Instructions (Signed)

## 2021-01-09 NOTE — Transfer of Care (Signed)
Immediate Anesthesia Transfer of Care Note  Patient: Anne Montgomery  Procedure(s) Performed: COLONOSCOPY WITH PROPOFOL (N/A Rectum)  Patient Location: PACU  Anesthesia Type: General  Level of Consciousness: awake, alert  and patient cooperative  Airway and Oxygen Therapy: Patient Spontanous Breathing and Patient connected to supplemental oxygen  Post-op Assessment: Post-op Vital signs reviewed, Patient's Cardiovascular Status Stable, Respiratory Function Stable, Patent Airway and No signs of Nausea or vomiting  Post-op Vital Signs: Reviewed and stable  Complications: No complications documented.

## 2021-01-09 NOTE — Anesthesia Preprocedure Evaluation (Signed)
Anesthesia Evaluation  Patient identified by MRN, date of birth, ID band Patient awake    Reviewed: Allergy & Precautions, NPO status , Patient's Chart, lab work & pertinent test results  Airway Mallampati: II  TM Distance: >3 FB Neck ROM: Full    Dental no notable dental hx.    Pulmonary neg pulmonary ROS,    Pulmonary exam normal breath sounds clear to auscultation       Cardiovascular negative cardio ROS Normal cardiovascular exam Rhythm:Regular Rate:Normal     Neuro/Psych  Headaches, negative psych ROS   GI/Hepatic Neg liver ROS, GERD  ,  Endo/Other  negative endocrine ROS  Renal/GU negative Renal ROS  negative genitourinary   Musculoskeletal negative musculoskeletal ROS (+)   Abdominal (+) + obese,  Abdomen: soft.    Peds negative pediatric ROS (+)  Hematology negative hematology ROS (+)   Anesthesia Other Findings   Reproductive/Obstetrics negative OB ROS                             Anesthesia Physical Anesthesia Plan  ASA: II  Anesthesia Plan: General   Post-op Pain Management:    Induction: Intravenous  PONV Risk Score and Plan: 3 and Treatment may vary due to age or medical condition  Airway Management Planned: Natural Airway  Additional Equipment:   Intra-op Plan:   Post-operative Plan:   Informed Consent: I have reviewed the patients History and Physical, chart, labs and discussed the procedure including the risks, benefits and alternatives for the proposed anesthesia with the patient or authorized representative who has indicated his/her understanding and acceptance.     Dental advisory given  Plan Discussed with: Anesthesiologist and CRNA  Anesthesia Plan Comments:         Anesthesia Quick Evaluation   Patient Active Problem List   Diagnosis Date Noted  . Routine health maintenance 12/24/2020  . Screening for colon cancer 12/19/2020  .  History of migraine 12/19/2020  . Vitamin D deficiency 12/19/2020  . Vitamin B12 deficiency 12/19/2020  . History of hematuria- has seen urologist for work up.  12/19/2020  . Body mass index (BMI) of 37.0-37.9 in adult 12/19/2020  . Migraine headache 12/26/2015    CBC Latest Ref Rng & Units 12/23/2020 12/26/2015 12/07/2013  WBC 3.4 - 10.8 x10E3/uL 8.5 8.4 8.9  Hemoglobin 11.1 - 15.9 g/dL 14.8 14.5 14.9  Hematocrit 34.0 - 46.6 % 45.5 43.6 44.0  Platelets 150 - 450 x10E3/uL 377 307 336   BMP Latest Ref Rng & Units 12/23/2020 12/26/2015 12/05/2007  Glucose 65 - 99 mg/dL 92 85 79  BUN 6 - 24 mg/dL 15 14 10   Creatinine 0.57 - 1.00 mg/dL 0.81 0.77 0.87  BUN/Creat Ratio 9 - 23 19 18  -  Sodium 134 - 144 mmol/L 136 138 137  Potassium 3.5 - 5.2 mmol/L 4.8 4.3 4.1  Chloride 96 - 106 mmol/L 99 100 107  CO2 20 - 29 mmol/L 20 20 21   Calcium 8.7 - 10.2 mg/dL 9.9 9.4 9.6    Risks and benefits of anesthesia discussed at length, patient or surrogate demonstrates understanding. Appropriately NPO. Plan to proceed with anesthesia.  Champ Mungo, MD 01/09/21

## 2021-01-09 NOTE — Anesthesia Postprocedure Evaluation (Signed)
Anesthesia Post Note  Patient: Anne Montgomery  Procedure(s) Performed: COLONOSCOPY WITH PROPOFOL (N/A Rectum)     Patient location during evaluation: PACU Anesthesia Type: General Level of consciousness: awake and alert Pain management: pain level controlled Vital Signs Assessment: post-procedure vital signs reviewed and stable Respiratory status: spontaneous breathing, nonlabored ventilation, respiratory function stable and patient connected to nasal cannula oxygen Cardiovascular status: blood pressure returned to baseline and stable Postop Assessment: no apparent nausea or vomiting Anesthetic complications: no   No complications documented.  Sinda Du

## 2021-01-10 ENCOUNTER — Encounter: Payer: Self-pay | Admitting: Gastroenterology

## 2021-09-14 ENCOUNTER — Telehealth: Payer: Self-pay | Admitting: Emergency Medicine

## 2021-09-14 DIAGNOSIS — J019 Acute sinusitis, unspecified: Secondary | ICD-10-CM

## 2021-09-14 DIAGNOSIS — B9689 Other specified bacterial agents as the cause of diseases classified elsewhere: Secondary | ICD-10-CM

## 2021-09-14 MED ORDER — AMOXICILLIN-POT CLAVULANATE 875-125 MG PO TABS: 1.0000 | ORAL_TABLET | Freq: Two times a day (BID) | ORAL | 0 refills | Status: DC

## 2021-09-14 NOTE — Progress Notes (Signed)

## 2021-09-20 ENCOUNTER — Ambulatory Visit (INDEPENDENT_AMBULATORY_CARE_PROVIDER_SITE_OTHER): Payer: Self-pay | Admitting: Dermatology

## 2021-09-20 ENCOUNTER — Other Ambulatory Visit: Payer: Self-pay

## 2021-09-20 DIAGNOSIS — L820 Inflamed seborrheic keratosis: Secondary | ICD-10-CM

## 2021-09-20 NOTE — Progress Notes (Signed)
   Follow-Up Visit   Subjective  Anne Montgomery is a 50 y.o. female who presents for the following: Skin Problem (Check a itchy irritated spot on the left clavicle ).  The following portions of the chart were reviewed this encounter and updated as appropriate:   Tobacco  Allergies  Meds  Problems  Med Hx  Surg Hx  Fam Hx     Review of Systems:  No other skin or systemic complaints except as noted in HPI or Assessment and Plan.  Objective  Well appearing patient in no apparent distress; mood and affect are within normal limits.  A focused examination was performed including chest . Relevant physical exam findings are noted in the Assessment and Plan.  left clavicle x 1 Erythematous keratotic or waxy stuck-on papule or plaque.    Assessment & Plan  Inflamed seborrheic keratosis left clavicle x 1  Reassured benign age-related growth.  Recommend observation.  Discussed cryotherapy if spot(s) become irritated or inflamed.    Prior to procedure, discussed risks of blister formation, small wound, skin dyspigmentation, or rare scar following cryotherapy. Recommend Vaseline ointment to treated areas while healing.   Destruction of lesion - left clavicle x 1 Complexity: simple   Destruction method: cryotherapy   Informed consent: discussed and consent obtained   Timeout:  patient name, date of birth, surgical site, and procedure verified Lesion destroyed using liquid nitrogen: Yes   Region frozen until ice ball extended beyond lesion: Yes   Outcome: patient tolerated procedure well with no complications   Post-procedure details: wound care instructions given    Return if symptoms worsen or fail to improve.  IMarye Round, CMA, am acting as scribe for Sarina Ser, MD .  Documentation: I have reviewed the above documentation for accuracy and completeness, and I agree with the above.  Sarina Ser, MD

## 2021-09-20 NOTE — Patient Instructions (Addendum)

## 2021-09-29 ENCOUNTER — Encounter: Payer: Self-pay | Admitting: Dermatology

## 2022-03-28 ENCOUNTER — Ambulatory Visit: Payer: Self-pay | Admitting: Podiatry

## 2022-04-04 ENCOUNTER — Ambulatory Visit (INDEPENDENT_AMBULATORY_CARE_PROVIDER_SITE_OTHER): Payer: Self-pay

## 2022-04-04 ENCOUNTER — Encounter: Payer: Self-pay | Admitting: Podiatry

## 2022-04-04 ENCOUNTER — Ambulatory Visit (INDEPENDENT_AMBULATORY_CARE_PROVIDER_SITE_OTHER): Payer: Self-pay | Admitting: Podiatry

## 2022-04-04 ENCOUNTER — Other Ambulatory Visit: Payer: Self-pay | Admitting: Podiatry

## 2022-04-04 DIAGNOSIS — M778 Other enthesopathies, not elsewhere classified: Secondary | ICD-10-CM

## 2022-04-04 DIAGNOSIS — M722 Plantar fascial fibromatosis: Secondary | ICD-10-CM

## 2022-04-04 MED ORDER — METHYLPREDNISOLONE 4 MG PO TBPK: ORAL_TABLET | ORAL | 0 refills | Status: DC

## 2022-04-04 MED ORDER — MELOXICAM 15 MG PO TABS: 15.0000 mg | ORAL_TABLET | Freq: Every day | ORAL | 3 refills | Status: DC

## 2022-04-04 MED ORDER — TRIAMCINOLONE ACETONIDE 40 MG/ML IJ SUSP
40.0000 mg | Freq: Once | INTRAMUSCULAR | Status: AC
  Administered 2022-04-04: 40 mg

## 2022-04-04 NOTE — Progress Notes (Signed)
Subjective:  Patient ID: Anne Montgomery, female    DOB: [birth year],  MRN: [id number] HPI Chief Complaint  Patient presents with   Foot Pain    Plantar arch and heel bilateral - aching x 6 months, no AM pain, worse at end of day, tried multiple shoes-no help   New Patient (Initial Visit)    51 y.o. female presents with the above complaint.   ROS: Denies fever chills nausea vomiting muscle aches pains calf pain back pain chest pain shortness of breath.  Past Medical History:  Diagnosis Date   Actinic keratosis    Dysplastic nevus 08/05/2014   Right pubic. Mild atypia, limited margins free.   GERD (gastroesophageal reflux disease)    Migraines    2-3x/yr   Past Surgical History:  Procedure Laterality Date   BACK SURGERY  2009   BREAST BIOPSY  2009   back sur   COLONOSCOPY WITH PROPOFOL N/A 01/09/2021   Procedure: COLONOSCOPY WITH PROPOFOL;  Surgeon: Lucilla Lame, MD;  Location: [facility];  Service: Endoscopy;  Laterality: N/A;  priority 4   NASAL SINUS SURGERY     TUBAL LIGATION  2002    Current Outpatient Medications:    Estradiol (ESTROGEL) 0.75 MG/1.25 GM (0.06%) topical gel, , Disp: , Rfl:    meloxicam (MOBIC) 15 MG tablet, Take 1 tablet (15 mg total) by mouth daily., Disp: 30 tablet, Rfl: 3   methylPREDNISolone (MEDROL DOSEPAK) 4 MG TBPK tablet, 6 day dose pack - take as directed, Disp: 21 tablet, Rfl: 0   Ascorbic Acid (VITAMIN C PO), Take by mouth., Disp: , Rfl:    co-enzyme Q-10 30 MG capsule, Take 30 mg by mouth 3 (three) times daily., Disp: , Rfl:    GINSENG PO, Take by mouth., Disp: , Rfl:    MAGNESIUM PO, Take by mouth., Disp: , Rfl:    Misc Natural Products (JOINT SUPPORT COMPLEX PO), Take by mouth., Disp: , Rfl:    Multiple Vitamins-Minerals (ZINC PO), Take by mouth., Disp: , Rfl:    SUMAtriptan (IMITREX) 50 MG tablet, TAKE 1 TABLET BY MOUTH AT ONSET OF HEADACHE. MAY REPEAT IF NEEDED IN 2 HOURS, Disp: 10 tablet, Rfl: 1   TURMERIC PO, Take  by mouth., Disp: , Rfl:    VITAMIN D PO, Take 1,500 Units by mouth., Disp: , Rfl:   Allergies  Allergen Reactions   Biaxin [Clarithromycin]     Sleeplessness.  Metallic taste in mouth   Codeine Itching   Review of Systems Objective:  There were no vitals filed for this visit.  General: Well developed, nourished, in no acute distress, alert and oriented x3   Dermatological: Skin is warm, dry and supple bilateral. Nails x 10 are well maintained; remaining integument appears unremarkable at this time. There are no open sores, no preulcerative lesions, no rash or signs of infection present.  Vascular: Dorsalis Pedis artery and Posterior Tibial artery pedal pulses are 2/4 bilateral with immedate capillary fill time. Pedal hair growth present. No varicosities and no lower extremity edema present bilateral.   Neruologic: Grossly intact via light touch bilateral. Vibratory intact via tuning fork bilateral. Protective threshold with Semmes Wienstein monofilament intact to all pedal sites bilateral. Patellar and Achilles deep tendon reflexes 2+ bilateral. No Babinski or clonus noted bilateral.   Musculoskeletal: No gross boney pedal deformities bilateral. No pain, crepitus, or limitation noted with foot and ankle range of motion bilateral. Muscular strength 5/5 in all groups tested bilateral.  Pain on palpation  medial calcaneal tubercles bilateral.  Hammertoe deformities flexible in nature  Gait: Unassisted, Nonantalgic.    Radiographs:  Radiographs demonstrate an osseously mature foot mild hammertoe deformities are noted bilaterally.  Soft tissue increase in density plantar fascial Caney insertion site at the bilateral heels.  No spurs.  Assessment & Plan:   Assessment: Planter fasciitis bilateral.  Plan: Discussed etiology pathology conservative versus surgical therapies.  She is tried multiple shoes that do not provide her with much comfort.  We did discuss appropriate shoe gear stretching  exercise ice therapy shoe gear modifications.  I also injected the bilateral heels today with 20 mg Kenalog 5 mg Marcaine and starting her on methylprednisolone to be followed by meloxicam.  Placed on plantar fascia braces and I will follow-up with her in 1 month.     Nyisha Clippard T. Alvarado, [place]

## 2022-05-09 ENCOUNTER — Ambulatory Visit: Payer: Self-pay | Admitting: Podiatry

## 2022-06-29 ENCOUNTER — Emergency Department (HOSPITAL_COMMUNITY)
Admission: EM | Admit: 2022-06-29 | Discharge: 2022-06-30 | Discharge status: Against Medical Advice | Payer: Self-pay | Attending: Emergency Medicine | Admitting: Emergency Medicine

## 2022-06-29 ENCOUNTER — Encounter (HOSPITAL_COMMUNITY): Payer: Self-pay

## 2022-06-29 ENCOUNTER — Emergency Department (HOSPITAL_COMMUNITY): Payer: Self-pay

## 2022-06-29 DIAGNOSIS — Z5321 Procedure and treatment not carried out due to patient leaving prior to being seen by health care provider: Secondary | ICD-10-CM | POA: Insufficient documentation

## 2022-06-29 DIAGNOSIS — R42 Dizziness and giddiness: Secondary | ICD-10-CM | POA: Insufficient documentation

## 2022-06-29 DIAGNOSIS — R202 Paresthesia of skin: Secondary | ICD-10-CM | POA: Insufficient documentation

## 2022-06-29 LAB — CBC WITH DIFFERENTIAL/PLATELET
Abs Immature Granulocytes: 0.03 10*3/uL (ref 0.00–0.07)
Basophils Absolute: 0 10*3/uL (ref 0.0–0.1)
Basophils Relative: 1 %
Eosinophils Absolute: 0.1 10*3/uL (ref 0.0–0.5)
Eosinophils Relative: 2 %
HCT: 44.7 % (ref 36.0–46.0)
Hemoglobin: 14.2 g/dL (ref 12.0–15.0)
Immature Granulocytes: 0 %
Lymphocytes Relative: 32 %
Lymphs Abs: 2.7 10*3/uL (ref 0.7–4.0)
MCH: 30 pg (ref 26.0–34.0)
MCHC: 31.8 g/dL (ref 30.0–36.0)
MCV: 94.5 fL (ref 80.0–100.0)
Monocytes Absolute: 0.7 10*3/uL (ref 0.1–1.0)
Monocytes Relative: 9 %
Neutro Abs: 4.7 10*3/uL (ref 1.7–7.7)
Neutrophils Relative %: 56 %
Platelets: 289 10*3/uL (ref 150–400)
RBC: 4.73 MIL/uL (ref 3.87–5.11)
RDW: 12.3 % (ref 11.5–15.5)
WBC: 8.3 10*3/uL (ref 4.0–10.5)
nRBC: 0 % (ref 0.0–0.2)

## 2022-06-29 LAB — COMPREHENSIVE METABOLIC PANEL
ALT: 16 U/L (ref 0–44)
AST: 21 U/L (ref 15–41)
Albumin: 4.3 g/dL (ref 3.5–5.0)
Alkaline Phosphatase: 86 U/L (ref 38–126)
Anion gap: 10 (ref 5–15)
BUN: 15 mg/dL (ref 6–20)
CO2: 22 mmol/L (ref 22–32)
Calcium: 9.7 mg/dL (ref 8.9–10.3)
Chloride: 104 mmol/L (ref 98–111)
Creatinine, Ser: 0.76 mg/dL (ref 0.44–1.00)
GFR, Estimated: >60 mL/min (ref >60)
Glucose, Bld: 136 mg/dL — ABNORMAL HIGH (ref 70–99)
Potassium: 4.1 mmol/L (ref 3.5–5.1)
Sodium: 136 mmol/L (ref 135–145)
Total Bilirubin: <0.1 mg/dL — ABNORMAL LOW (ref 0.3–1.2)
Total Protein: 6.8 g/dL (ref 6.5–8.1)

## 2022-06-29 LAB — MAGNESIUM: Magnesium: 2.1 mg/dL (ref 1.7–2.4)

## 2022-06-29 LAB — TROPONIN I (HIGH SENSITIVITY): Troponin I (High Sensitivity): 4 ng/L (ref <18)

## 2022-06-29 MED ORDER — IOHEXOL 350 MG/ML SOLN
75.0000 mL | Freq: Once | INTRAVENOUS | Status: AC | PRN
  Administered 2022-06-29: 75 mL via INTRAVENOUS

## 2022-06-29 NOTE — ED Provider Triage Note (Signed)
Emergency Medicine Provider Triage Evaluation Note  Anne Montgomery , a 51 y.o. female  was evaluated in triage.  Pt complains of lightheadedness feeling for the past 5 days.  She reports that today she noticed some tingling to the left side of her arm.  While she was in the waiting room.  She reports diffuse facial tingling.  Denies any trouble walking or trouble talking.  She denies any chest pain or shortness of breath.  She reports a history of vertigo and this does not feel similar.  She denies any room spinning sensation..  Review of Systems  Positive:  Negative:   Physical Exam  BP (!) 156/95 (BP Location: Right Arm)   Pulse 65   Temp 98.8 F (37.1 C) (Oral)   Resp 16   SpO2 98%  Gen:   Awake, no distress   Resp:  Normal effort  MSK:   Moves extremities without difficulty  Other:  Cranial nerves II through XII grossly intact.  No pronator drift.  Patient is answering questions appropriately with appropriate speech.  Medical Decision Making  Medically screening exam initiated at 4:11 PM.  Appropriate orders placed.  Donella Stade was informed that the remainder of the evaluation will be completed by another provider, this initial triage assessment does not replace that evaluation, and the importance of remaining in the ED until their evaluation is complete.  We will order CTA of head and neck.  Discussed with my attending who agrees.   Sherrell Puller, PA-C 06/29/22 1612

## 2022-06-29 NOTE — ED Triage Notes (Signed)
Pt seen at chiropractor today for "swimmy headedness" that has not gone away and been present since Monday. Chiropractor told Pt to go to ER b/c he felt she was having a problem with her carotid. Pt ran errands and then went home. At home started experiencing light headed, tingling in left arm and hand. While sitting in the waiting room pt began experiencing left sided facial tingling.  Neuro intact.  LKW late afternoon on 06/28/2022

## 2022-06-30 NOTE — ED Notes (Signed)
Pt stated she was leaving AMA due to wait 

## 2022-07-23 ENCOUNTER — Other Ambulatory Visit: Payer: Self-pay | Admitting: Family

## 2022-07-23 ENCOUNTER — Other Ambulatory Visit (HOSPITAL_COMMUNITY): Payer: Self-pay | Admitting: Family

## 2022-07-23 DIAGNOSIS — R42 Dizziness and giddiness: Secondary | ICD-10-CM

## 2022-07-23 DIAGNOSIS — R519 Headache, unspecified: Secondary | ICD-10-CM

## 2022-07-23 DIAGNOSIS — R413 Other amnesia: Secondary | ICD-10-CM

## 2022-07-30 ENCOUNTER — Ambulatory Visit
Admission: RE | Admit: 2022-07-30 | Discharge: 2022-07-30 | Disposition: A | Discharge status: Home / Self Care | Payer: Self-pay | Source: Ambulatory Visit | Attending: Family | Admitting: Family

## 2022-07-30 DIAGNOSIS — R519 Headache, unspecified: Secondary | ICD-10-CM | POA: Insufficient documentation

## 2022-07-30 DIAGNOSIS — R413 Other amnesia: Secondary | ICD-10-CM | POA: Insufficient documentation

## 2022-07-30 DIAGNOSIS — R42 Dizziness and giddiness: Secondary | ICD-10-CM | POA: Insufficient documentation

## 2022-07-30 MED ORDER — GADOBUTROL 1 MMOL/ML IV SOLN
8.0000 mL | Freq: Once | INTRAVENOUS | Status: AC | PRN
  Administered 2022-07-30: 8 mL via INTRAVENOUS

## 2022-08-15 ENCOUNTER — Ambulatory Visit: Payer: Self-pay | Admitting: Psychiatry

## 2022-08-20 ENCOUNTER — Ambulatory Visit: Payer: Self-pay | Admitting: Neurology

## 2022-09-05 ENCOUNTER — Encounter: Payer: Self-pay | Admitting: Neurology

## 2022-09-10 ENCOUNTER — Encounter: Payer: Self-pay | Admitting: Psychiatry

## 2022-09-10 ENCOUNTER — Ambulatory Visit (INDEPENDENT_AMBULATORY_CARE_PROVIDER_SITE_OTHER): Payer: Self-pay | Admitting: Psychiatry

## 2022-09-10 VITALS — BP 141/89 | HR 70 | Ht 68.0 in | Wt 196.0 lb

## 2022-09-10 DIAGNOSIS — R202 Paresthesia of skin: Secondary | ICD-10-CM

## 2022-09-10 DIAGNOSIS — G43809 Other migraine, not intractable, without status migrainosus: Secondary | ICD-10-CM

## 2022-09-10 MED ORDER — TOPIRAMATE 25 MG PO TABS: ORAL_TABLET | ORAL | 0 refills | Status: DC

## 2022-09-10 NOTE — Progress Notes (Signed)
[facility]  PATIENT: Anne Montgomery DOB: 08-20-1971  REFERRING CLINICIAN: Lanice Schwab, NP HISTORY FROM: self, spouse Lanny Hurst REASON FOR VISIT: dizziness, brain fog   HISTORICAL  CHIEF COMPLAINT:  Chief Complaint  Patient presents with   Headache    RM 1 with spouse Lanny Hurst  Pt is well, here for dizziness and brain fog that both have been going on for 11 weeks, not headache. Ringing in ear, Paresthesia has resolved.  Has seen ENT     HISTORY OF PRESENT ILLNESS:  The patient presents for evaluation for dizziness and brain fog which have been present over the past 11 weeks. No clear inciting events. States she woke up one day and her head felt "funny". Had paresthesias in her left face and hand which resolved later that day. This feeling went away for a couple of days, then returned and has persisted since then. It has been improving somewhat in the past 2 weeks. She denies room spinning but states she feels off balance, even when she is sitting still. Did have one episode of true vertigo which resolved with the Epley maneuver. Dizziness typically lasts 5-10 minutes, but can last for hours at a time. Feels very fatigued and has tinnitus in her right ear which is worse when bending forward. She does endorse an intermittent frontal headache and pain in both ears which began around the same time as the dizziness. No photophobia or nausea/vomiting, but does have some phonophobia.   States she was diagnosed with Meniere's disease 2 years ago. Marye Round was reportedly negative x2. She saw ENT who prescribed HCTZ and steroids for presumed Meniere's disease. She stopped because she did not find this helpful. She was referred to another ENT specialist in [facility], but doesn't have this appointment until next year.  She has a history of migraines and takes Imitrex for these. States she does have a history of vestibular migraine. Those symptoms felt similar to this  episode. She did not have her typical migraine headaches with her prior episode of vestibular migraine. Took Topamax for 2 days with her first episode which resolved her symptoms. She did try Topamax for a few nights with her current episode, but did not find it helpful. States she cannot tolerate doses over 25 mg QHS.  MRI brain 07/30/22 and CTA head/neck 06/29/22 were normal (images personally reviewed 09/10/22).  OTHER MEDICAL CONDITIONS: migraines, B12 deficiency   REVIEW OF SYSTEMS: Full 14 system review of systems performed and negative with exception of: dizziness, brain fog  ALLERGIES: Allergies  Allergen Reactions   Biaxin [Clarithromycin]     Sleeplessness.  Metallic taste in mouth   Codeine Itching    HOME MEDICATIONS: Outpatient Medications Prior to Visit  Medication Sig Dispense Refill   Ascorbic Acid (VITAMIN C PO) Take by mouth.     Cholecalciferol (VITAMIN D-3) 125 MCG (5000 UT) TABS Take by mouth.     co-enzyme Q-10 30 MG capsule Take 30 mg by mouth 3 (three) times daily.     GINSENG PO Take by mouth.     MAGNESIUM PO Take by mouth.     Misc Natural Products (JOINT SUPPORT COMPLEX PO) Take by mouth.     Multiple Vitamins-Minerals (ZINC PO) Take by mouth.     Specialty Vitamins Products (COLLAGEN ULTRA PO) Take by mouth daily.     SUMAtriptan (IMITREX) 50 MG tablet TAKE 1 TABLET BY MOUTH AT ONSET OF HEADACHE. MAY REPEAT IF NEEDED IN 2 HOURS  10 tablet 1   TURMERIC PO Take by mouth.     vitamin B-12 (CYANOCOBALAMIN) 100 MCG tablet Take 100 mcg by mouth daily.     VITAMIN D PO Take 1,500 Units by mouth.     Zinc 20 MG CAPS Take by mouth.     Estradiol (ESTROGEL) 0.75 MG/1.25 GM (0.06%) topical gel  (Patient not taking: Reported on 09/10/2022)     hydrochlorothiazide (HYDRODIURIL) 12.5 MG tablet Take 12.5 mg by mouth daily.     meloxicam (MOBIC) 15 MG tablet Take 1 tablet (15 mg total) by mouth daily. 30 tablet 3   No facility-administered medications prior to visit.     PAST MEDICAL HISTORY: Past Medical History:  Diagnosis Date   Actinic keratosis    Dysplastic nevus 08/05/2014   Right pubic. Mild atypia, limited margins free.   GERD (gastroesophageal reflux disease)    Migraines    2-3x/yr    PAST SURGICAL HISTORY: Past Surgical History:  Procedure Laterality Date   BACK SURGERY  2009   BREAST BIOPSY  2009   back sur   COLONOSCOPY WITH PROPOFOL N/A 01/09/2021   Procedure: COLONOSCOPY WITH PROPOFOL;  Surgeon: Lucilla Lame, MD;  Location: [facility];  Service: Endoscopy;  Laterality: N/A;  priority 4   NASAL SINUS SURGERY     TUBAL LIGATION  2002    FAMILY HISTORY: Family History  Problem Relation Age of Onset   Lymphoma Father 80   Prostate cancer Father    Cancer Father        lymphoma   Kidney cancer Mother    Bladder Cancer Neg Hx     SOCIAL HISTORY: Social History   Socioeconomic History   Marital status: Married    Spouse name: Not on file   Number of children: Not on file   Years of education: Not on file   Highest education level: Not on file  Occupational History   Not on file  Tobacco Use   Smoking status: Never   Smokeless tobacco: Never  Vaping Use   Vaping Use: Never used  Substance and Sexual Activity   Alcohol use: No   Drug use: No   Sexual activity: Not on file  Other Topics Concern   Not on file  Social History Narrative   Not on file   Social Determinants of Health   Financial Resource Strain: Not on file  Food Insecurity: Not on file  Transportation Needs: Not on file  Physical Activity: Not on file  Stress: Not on file  Social Connections: Not on file  Intimate Partner Violence: Not on file     PHYSICAL EXAM  GENERAL EXAM/CONSTITUTIONAL: Vitals:  Vitals:   09/10/22 0917  BP: (!) 141/89  Pulse: 70  Weight: 196 lb (88.9 kg)  Height: '5\' 8"'$  (1.727 m)   Body mass index is 29.8 kg/m. Wt Readings from Last 3 Encounters:  09/10/22 196 lb (88.9 kg)  01/09/21 195 lb (88.5  kg)  12/19/20 203 lb (92.1 kg)  NEUROLOGIC: MENTAL STATUS:      09/10/2022    9:28 AM  Montreal Cognitive Assessment   Visuospatial/ Executive (0/5) 5  Naming (0/3) 3  Attention: Read list of digits (0/2) 2  Attention: Read list of letters (0/1) 1  Attention: Serial 7 subtraction starting at 100 (0/3) 3  Language: Repeat phrase (0/2) 2  Language : Fluency (0/1) 0  Abstraction (0/2) 2  Delayed Recall (0/5) 4  Orientation (0/6) 6  Total 28  CRANIAL NERVE:  2nd, 3rd, 4th, 6th - pupils equal and reactive to light, visual fields full to confrontation, extraocular muscles intact, no nystagmus 5th - facial sensation symmetric 7th - facial strength symmetric 8th - hearing intact 9th - palate elevates symmetrically, uvula midline 11th - shoulder shrug symmetric 12th - tongue protrusion midline  MOTOR:  normal bulk and tone, full strength in the BUE, BLE  SENSORY:  normal and symmetric to light touch all 4 extremities  COORDINATION:  finger-nose-finger, fine finger movements normal  REFLEXES:  deep tendon reflexes present and symmetric  GAIT/STATION:  normal     DIAGNOSTIC DATA (LABS, IMAGING, TESTING) - I reviewed patient records, labs, notes, testing and imaging myself where available.  Lab Results  Component Value Date   WBC 8.3 06/29/2022   HGB 14.2 06/29/2022   HCT 44.7 06/29/2022   MCV 94.5 06/29/2022   PLT 289 06/29/2022      Component Value Date/Time   NA 136 06/29/2022 1823   NA 136 12/23/2020 0919   K 4.1 06/29/2022 1823   CL 104 06/29/2022 1823   CO2 22 06/29/2022 1823   GLUCOSE 136 (H) 06/29/2022 1823   BUN 15 06/29/2022 1823   BUN 15 12/23/2020 0919   CREATININE 0.76 06/29/2022 1823   CALCIUM 9.7 06/29/2022 1823   PROT 6.8 06/29/2022 1823   PROT 7.3 12/23/2020 0919   ALBUMIN 4.3 06/29/2022 1823   ALBUMIN 4.7 12/23/2020 0919   AST 21 06/29/2022 1823   ALT 16 06/29/2022 1823   ALKPHOS 86 06/29/2022 1823   BILITOT <0.1 (L) 06/29/2022  1823   BILITOT 0.3 12/23/2020 0919   GFRNONAA >60 06/29/2022 1823   GFRAA 107 12/26/2015 1122   Lab Results  Component Value Date   CHOL 197 12/23/2020   HDL 53 12/23/2020   LDLCALC 122 (H) 12/23/2020   TRIG 126 12/23/2020   CHOLHDL 3.7 12/23/2020   No results found for: "HGBA1C" No results found for: "VITAMINB12" Lab Results  Component Value Date   TSH 3.760 12/23/2020     ASSESSMENT AND PLAN  51 y.o. year old female with a history of migraines and vitamin B12 deficiency who presents for evaluation of dizziness and brain fog. Neurologic testing including MRI brain, CTA head/neck, and MOCA are normal. Will check thyroid levels for paresthesias and brain fog. If these are normal, suspect she may be having a recurrence of her vestibular migraines. Will start low dose Topamax at bedtime for migraine prevention. If she is unable to tolerate higher doses may consider switching to Zonisamide. Encouraged her to keep her ENT appt at [facility] to help determine if her prior diagnosis of Meniere's disease may be contributing to her dizziness.   1. Vestibular migraine   2. Paresthesias       PLAN: -Blood work: TSH -Start Topamax 25 mg QHS -Next steps: consider zonisamide for migraine prevention if unable to tolerate Topamax  Orders Placed This Encounter  Procedures   TSH    Meds ordered this encounter  Medications   topiramate (TOPAMAX) 25 MG tablet    Sig: Take 1 pill at bedtime    Dispense:  30 tablet    Refill:  0    Return in about 6 months (around 03/11/2023).    Genia Harold, MD 09/10/22 10:15 AM  I spent an average of 62 minutes chart reviewing and counseling the patient, with at least 50% of the time face to face with the patient.   [facility] [phone]  [address] [place], [place] [address] [phone]

## 2022-09-11 LAB — TSH: TSH: 2.9 u[IU]/mL (ref 0.450–4.500)

## 2022-10-10 ENCOUNTER — Other Ambulatory Visit: Payer: Self-pay

## 2022-10-10 MED ORDER — TOPIRAMATE 25 MG PO TABS: ORAL_TABLET | ORAL | 6 refills | Status: DC

## 2022-12-05 ENCOUNTER — Telehealth: Payer: Self-pay | Admitting: Physician Assistant

## 2022-12-05 DIAGNOSIS — J208 Acute bronchitis due to other specified organisms: Secondary | ICD-10-CM

## 2022-12-05 MED ORDER — PROMETHAZINE-DM 6.25-15 MG/5ML PO SYRP: 5.0000 mL | ORAL_SOLUTION | Freq: Four times a day (QID) | ORAL | 0 refills | Status: DC | PRN

## 2022-12-05 MED ORDER — BENZONATATE 100 MG PO CAPS: 100.0000 mg | ORAL_CAPSULE | Freq: Three times a day (TID) | ORAL | 0 refills | Status: DC | PRN

## 2022-12-05 NOTE — Progress Notes (Signed)
We are sorry that you are not feeling well.  Here is how we plan to help!  Based on your presentation I believe you most likely have A cough due to a virus.  This is called viral bronchitis and is best treated by rest, plenty of fluids and control of the cough.  You may use Ibuprofen or Tylenol as directed to help your symptoms.     In addition you may use A prescription cough medication called Tessalon Perles 152m. You may take 1-2 capsules every 8 hours as needed for your cough. And Promethazine DM cough syrup Take 535mevery 6-8 hours as needed for cough. Can be used together with Tessalon perles.    From your responses in the eVisit questionnaire you describe inflammation in the upper respiratory tract which is causing a significant cough.  This is commonly called Bronchitis and has four common causes:   Allergies Viral Infections Acid Reflux Bacterial Infection Allergies, viruses and acid reflux are treated by controlling symptoms or eliminating the cause. An example might be a cough caused by taking certain blood pressure medications. You stop the cough by changing the medication. Another example might be a cough caused by acid reflux. Controlling the reflux helps control the cough.  USE OF BRONCHODILATOR ("RESCUE") INHALERS: There is a risk from using your bronchodilator too frequently.  The risk is that over-reliance on a medication which only relaxes the muscles surrounding the breathing tubes can reduce the effectiveness of medications prescribed to reduce swelling and congestion of the tubes themselves.  Although you feel brief relief from the bronchodilator inhaler, your asthma may actually be worsening with the tubes becoming more swollen and filled with mucus.  This can delay other crucial treatments, such as oral steroid medications. If you need to use a bronchodilator inhaler daily, several times per day, you should discuss this with your provider.  There are probably better treatments  that could be used to keep your asthma under control.     HOME CARE Only take medications as instructed by your medical team. Complete the entire course of an antibiotic. Drink plenty of fluids and get plenty of rest. Avoid close contacts especially the very young and the elderly Cover your mouth if you cough or cough into your sleeve. Always remember to wash your hands A steam or ultrasonic humidifier can help congestion.   GET HELP RIGHT AWAY IF: You develop worsening fever. You become short of breath You cough up blood. Your symptoms persist after you have completed your treatment plan MAKE SURE YOU  Understand these instructions. Will watch your condition. Will get help right away if you are not doing well or get worse.    Thank you for choosing an e-visit.  Your e-visit answers were reviewed by a board certified advanced clinical practitioner to complete your personal care plan. Depending upon the condition, your plan could have included both over the counter or prescription medications.  Please review your pharmacy choice. Make sure the pharmacy is open so you can pick up prescription now. If there is a problem, you may contact your provider through [employer] have the prescription routed to another pharmacy.  Your safety is important to us[place]If you have drug allergies check your prescription carefully.   For the next 24 hours you can use MyChart to ask questions about today's visit, request a non-urgent call back, or ask for a work or school excuse. You will get an email in the next two days  asking about your experience. I hope that your e-visit has been valuable and will speed your recovery.  I have spent 5 minutes in review of e-visit questionnaire, review and updating patient chart, medical decision making and response to patient.   Mar Daring, PA-C

## 2023-03-18 ENCOUNTER — Ambulatory Visit: Payer: Self-pay | Admitting: Family Medicine

## 2023-04-15 ENCOUNTER — Other Ambulatory Visit: Payer: Self-pay | Admitting: Podiatry

## 2023-08-15 ENCOUNTER — Encounter: Payer: Self-pay | Admitting: Dermatology

## 2023-08-15 ENCOUNTER — Ambulatory Visit (INDEPENDENT_AMBULATORY_CARE_PROVIDER_SITE_OTHER): Payer: Self-pay | Admitting: Dermatology

## 2023-08-15 DIAGNOSIS — W908XXA Exposure to other nonionizing radiation, initial encounter: Secondary | ICD-10-CM

## 2023-08-15 DIAGNOSIS — L821 Other seborrheic keratosis: Secondary | ICD-10-CM

## 2023-08-15 DIAGNOSIS — L570 Actinic keratosis: Secondary | ICD-10-CM

## 2023-08-15 NOTE — Progress Notes (Signed)
   Follow-Up Visit   Subjective  Anne Montgomery is a 52 y.o. female who presents for the following: Scaly spot on the right upper lip. Area has been treated with cryotherapy 2-3 times in the past, last treatment 2021. She also has an itchy spot on the left inframammary.   The patient has spots, moles and lesions to be evaluated, some may be new or changing and the patient may have concern these could be cancer.   The following portions of the chart were reviewed this encounter and updated as appropriate: medications, allergies, medical history  Review of Systems:  No other skin or systemic complaints except as noted in HPI or Assessment and Plan.  Objective  Well appearing patient in no apparent distress; mood and affect are within normal limits.  A focused examination was performed of the following areas: Face  Relevant physical exam findings are noted in the Assessment and Plan.  Right Upper Lip Erythematous thin papules/macules with gritty scale.     Assessment & Plan   AK (actinic keratosis) Right Upper Lip  Recurrent after cryotherapy, treated in the past.  Discussed cryotherapy vs 5FU cream. Patient prefers to treat with cryotherapy today. If recurs, will treat with 5FU cream. RTC if recurs.   Actinic keratoses are precancerous spots that appear secondary to cumulative UV radiation exposure/sun exposure over time. They are chronic with expected duration over 1 year. A portion of actinic keratoses will progress to squamous cell carcinoma of the skin. It is not possible to reliably predict which spots will progress to skin cancer and so treatment is recommended to prevent development of skin cancer.  Recommend daily broad spectrum sunscreen SPF 30+ to sun-exposed areas, reapply every 2 hours as needed.  Recommend staying in the shade or wearing long sleeves, sun glasses (UVA+UVB protection) and wide brim hats (4-inch brim around the entire circumference of the  hat). Call for new or changing lesions.   SEBORRHEIC KERATOSIS - Stuck-on, waxy, tan-brown papules and/or plaques, left inframammary  - Benign-appearing - Discussed benign etiology and prognosis. - Observe - Call for any changes   Return if symptoms worsen or fail to improve.  Wendee Beavers, CMA, am acting as scribe for Elie Goody, MD .   Documentation: I have reviewed the above documentation for accuracy and completeness, and I agree with the above.  Elie Goody, MD

## 2023-08-15 NOTE — Patient Instructions (Signed)

## 2023-08-18 ENCOUNTER — Other Ambulatory Visit: Payer: Self-pay | Admitting: Podiatry

## 2023-11-07 ENCOUNTER — Telehealth: Payer: Self-pay | Admitting: Family Medicine

## 2023-11-07 DIAGNOSIS — J209 Acute bronchitis, unspecified: Secondary | ICD-10-CM

## 2023-11-07 MED ORDER — ALBUTEROL SULFATE HFA 108 (90 BASE) MCG/ACT IN AERS: 1.0000 | INHALATION_SPRAY | Freq: Four times a day (QID) | RESPIRATORY_TRACT | 0 refills | Status: AC | PRN

## 2023-11-07 MED ORDER — AZITHROMYCIN 250 MG PO TABS: ORAL_TABLET | ORAL | 0 refills | Status: AC

## 2023-11-07 NOTE — Progress Notes (Signed)
E-Visit for Cough   We are sorry that you are not feeling well.  Here is how we plan to help!  Based on your presentation I believe you most likely have a form of bronchitis.   In addition you may use A non-prescription cough medication called Mucinex DM: take 2 tablets every 12 hours.  I will order an albuterol inhaler for you.  I will order you a zpack as well.   From your responses in the eVisit questionnaire you describe inflammation in the upper respiratory tract which is causing a significant cough.  This is commonly called Bronchitis and has four common causes:   Allergies Viral Infections Acid Reflux Bacterial Infection Allergies, viruses and acid reflux are treated by controlling symptoms or eliminating the cause. An example might be a cough caused by taking certain blood pressure medications. You stop the cough by changing the medication. Another example might be a cough caused by acid reflux. Controlling the reflux helps control the cough.  USE OF BRONCHODILATOR ("RESCUE") INHALERS: There is a risk from using your bronchodilator too frequently.  The risk is that over-reliance on a medication which only relaxes the muscles surrounding the breathing tubes can reduce the effectiveness of medications prescribed to reduce swelling and congestion of the tubes themselves.  Although you feel brief relief from the bronchodilator inhaler, your asthma may actually be worsening with the tubes becoming more swollen and filled with mucus.  This can delay other crucial treatments, such as oral steroid medications. If you need to use a bronchodilator inhaler daily, several times per day, you should discuss this with your provider.  There are probably better treatments that could be used to keep your asthma under control.     HOME CARE Only take medications as instructed by your medical team. Complete the entire course of an antibiotic. Drink plenty of fluids and get plenty of rest. Avoid close  contacts especially the very young and the elderly Cover your mouth if you cough or cough into your sleeve. Always remember to wash your hands A steam or ultrasonic humidifier can help congestion.   GET HELP RIGHT AWAY IF: You develop worsening fever. You become short of breath You cough up blood. Your symptoms persist after you have completed your treatment plan MAKE SURE YOU  Understand these instructions. Will watch your condition. Will get help right away if you are not doing well or get worse.    Thank you for choosing an e-visit.  Your e-visit answers were reviewed by a board certified advanced clinical practitioner to complete your personal care plan. Depending upon the condition, your plan could have included both over the counter or prescription medications.  Please review your pharmacy choice. Make sure the pharmacy is open so you can pick up prescription now. If there is a problem, you may contact your provider through [employer] and have the prescription routed to another pharmacy.  Your safety is important to [place]. If you have drug allergies check your prescription carefully.   For the next 24 hours you can use MyChart to ask questions about today's visit, request a non-urgent call back, or ask for a work or school excuse. You will get an email in the next two days asking about your experience. I hope that your e-visit has been valuable and will speed your recovery.  I provided 5 minutes of non face-to-face time during this encounter for chart review, medication and order placement, as well as and documentation.

## 2024-07-13 ENCOUNTER — Other Ambulatory Visit: Payer: Self-pay | Admitting: Otolaryngology

## 2024-07-13 DIAGNOSIS — H9311 Tinnitus, right ear: Secondary | ICD-10-CM

## 2024-07-13 DIAGNOSIS — H9203 Otalgia, bilateral: Secondary | ICD-10-CM

## 2024-07-17 ENCOUNTER — Other Ambulatory Visit: Payer: Self-pay
